# Patient Record
Sex: Female | Born: 1980 | Race: White | Hispanic: No | Smoking: Current every day smoker
Health system: Southern US, Community
[De-identification: ages and names within clinical notes are randomized; demographics above are authoritative.]

## PROBLEM LIST (undated history)

## (undated) DIAGNOSIS — G5600 Carpal tunnel syndrome, unspecified upper limb: Secondary | ICD-10-CM

## (undated) DIAGNOSIS — F1910 Other psychoactive substance abuse, uncomplicated: Secondary | ICD-10-CM

## (undated) DIAGNOSIS — Z9114 Patient's other noncompliance with medication regimen: Secondary | ICD-10-CM

## (undated) DIAGNOSIS — F329 Major depressive disorder, single episode, unspecified: Secondary | ICD-10-CM

## (undated) DIAGNOSIS — F32A Depression, unspecified: Secondary | ICD-10-CM

## (undated) DIAGNOSIS — R569 Unspecified convulsions: Secondary | ICD-10-CM

## (undated) DIAGNOSIS — F319 Bipolar disorder, unspecified: Secondary | ICD-10-CM

## (undated) DIAGNOSIS — Z91148 Patient's other noncompliance with medication regimen for other reason: Secondary | ICD-10-CM

## (undated) HISTORY — PX: OVARIAN CYST REMOVAL: SHX89

---
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
## 1999-02-21 ENCOUNTER — Emergency Department (HOSPITAL_COMMUNITY): Admission: EM | Admit: 1999-02-21 | Discharge: 1999-02-21 | Payer: Self-pay | Admitting: Emergency Medicine

## 2001-06-18 ENCOUNTER — Emergency Department (HOSPITAL_COMMUNITY): Admission: EM | Admit: 2001-06-18 | Discharge: 2001-06-18 | Payer: Self-pay | Admitting: Emergency Medicine

## 2001-07-11 ENCOUNTER — Other Ambulatory Visit: Admission: RE | Admit: 2001-07-11 | Discharge: 2001-07-11 | Payer: Self-pay | Admitting: Obstetrics and Gynecology

## 2002-01-11 ENCOUNTER — Ambulatory Visit (HOSPITAL_COMMUNITY): Admission: RE | Admit: 2002-01-11 | Discharge: 2002-01-11 | Payer: Self-pay | Admitting: Obstetrics and Gynecology

## 2002-01-25 ENCOUNTER — Inpatient Hospital Stay (HOSPITAL_COMMUNITY): Admission: AD | Admit: 2002-01-25 | Discharge: 2002-01-28 | Payer: Self-pay | Admitting: Obstetrics and Gynecology

## 2002-03-21 ENCOUNTER — Emergency Department (HOSPITAL_COMMUNITY): Admission: EM | Admit: 2002-03-21 | Discharge: 2002-03-21 | Payer: Self-pay | Admitting: Internal Medicine

## 2002-03-24 ENCOUNTER — Emergency Department (HOSPITAL_COMMUNITY): Admission: EM | Admit: 2002-03-24 | Discharge: 2002-03-24 | Payer: Self-pay | Admitting: *Deleted

## 2002-08-30 ENCOUNTER — Emergency Department (HOSPITAL_COMMUNITY): Admission: EM | Admit: 2002-08-30 | Discharge: 2002-08-30 | Payer: Self-pay | Admitting: Emergency Medicine

## 2002-08-30 ENCOUNTER — Encounter: Payer: Self-pay | Admitting: Emergency Medicine

## 2004-07-31 ENCOUNTER — Emergency Department (HOSPITAL_COMMUNITY): Admission: EM | Admit: 2004-07-31 | Discharge: 2004-07-31 | Payer: Self-pay | Admitting: Emergency Medicine

## 2004-08-13 ENCOUNTER — Emergency Department (HOSPITAL_COMMUNITY): Admission: EM | Admit: 2004-08-13 | Discharge: 2004-08-13 | Payer: Self-pay | Admitting: *Deleted

## 2004-11-23 HISTORY — PX: CHOLECYSTECTOMY OPEN: SUR202

## 2005-05-05 ENCOUNTER — Ambulatory Visit (HOSPITAL_COMMUNITY): Admission: RE | Admit: 2005-05-05 | Discharge: 2005-05-05 | Payer: Self-pay | Admitting: Obstetrics and Gynecology

## 2005-06-12 ENCOUNTER — Inpatient Hospital Stay (HOSPITAL_COMMUNITY): Admission: RE | Admit: 2005-06-12 | Discharge: 2005-06-14 | Payer: Self-pay | Admitting: General Surgery

## 2005-09-03 ENCOUNTER — Ambulatory Visit (HOSPITAL_COMMUNITY): Admission: AD | Admit: 2005-09-03 | Discharge: 2005-09-03 | Payer: Self-pay | Admitting: Obstetrics and Gynecology

## 2005-10-26 ENCOUNTER — Inpatient Hospital Stay (HOSPITAL_COMMUNITY): Admission: AD | Admit: 2005-10-26 | Discharge: 2005-10-28 | Payer: Self-pay | Admitting: Obstetrics and Gynecology

## 2006-07-29 ENCOUNTER — Ambulatory Visit (HOSPITAL_COMMUNITY): Admission: RE | Admit: 2006-07-29 | Discharge: 2006-07-29 | Payer: Self-pay | Admitting: Obstetrics and Gynecology

## 2008-03-21 ENCOUNTER — Emergency Department (HOSPITAL_COMMUNITY): Admission: EM | Admit: 2008-03-21 | Discharge: 2008-03-21 | Payer: Self-pay | Admitting: Emergency Medicine

## 2008-03-28 ENCOUNTER — Ambulatory Visit (HOSPITAL_COMMUNITY): Admission: RE | Admit: 2008-03-28 | Discharge: 2008-03-28 | Payer: Self-pay | Admitting: Neurology

## 2009-07-05 ENCOUNTER — Other Ambulatory Visit: Payer: Self-pay

## 2009-07-05 ENCOUNTER — Ambulatory Visit: Payer: Self-pay | Admitting: Psychiatry

## 2009-07-05 ENCOUNTER — Inpatient Hospital Stay (HOSPITAL_COMMUNITY): Admission: RE | Admit: 2009-07-05 | Discharge: 2009-07-12 | Payer: Self-pay | Admitting: Psychiatry

## 2009-09-13 ENCOUNTER — Emergency Department (HOSPITAL_COMMUNITY): Admission: EM | Admit: 2009-09-13 | Discharge: 2009-09-13 | Payer: Self-pay | Admitting: Emergency Medicine

## 2009-09-15 ENCOUNTER — Other Ambulatory Visit: Payer: Self-pay | Admitting: Emergency Medicine

## 2009-09-15 ENCOUNTER — Ambulatory Visit: Payer: Self-pay | Admitting: Psychiatry

## 2009-09-15 ENCOUNTER — Inpatient Hospital Stay (HOSPITAL_COMMUNITY): Admission: EM | Admit: 2009-09-15 | Discharge: 2009-09-20 | Payer: Self-pay | Admitting: Psychiatry

## 2009-12-23 ENCOUNTER — Ambulatory Visit (HOSPITAL_COMMUNITY): Admission: RE | Admit: 2009-12-23 | Discharge: 2009-12-23 | Payer: Self-pay | Admitting: Obstetrics & Gynecology

## 2010-10-29 ENCOUNTER — Emergency Department (HOSPITAL_COMMUNITY)
Admission: EM | Admit: 2010-10-29 | Discharge: 2010-10-29 | Payer: Self-pay | Source: Home / Self Care | Admitting: Emergency Medicine

## 2010-11-11 LAB — ABO/RH: RH Type: POSITIVE

## 2010-11-11 LAB — HEPATITIS B SURFACE ANTIGEN: Hepatitis B Surface Ag: NEGATIVE

## 2010-11-23 NOTE — L&D Delivery Note (Signed)
Delivery Note At 6:46 PM a viable female was delivered via Vaginal, Spontaneous Delivery (Presentation: LOA;  ).  APGAR: 9, 9; weight .   Placenta status: Intact, Spontaneous.  Cord: 3 vessels with the following complications: None.   Anesthesia: Epidural had just been placed but not started. Episiotomy: None Lacerations: None Suture Repair: none Est. Blood Loss (mL):  Mom to postpartum.  Baby to nursery-stable.  Loran Fleet 06/30/2011, 7:03 PM

## 2011-02-02 ENCOUNTER — Other Ambulatory Visit (HOSPITAL_COMMUNITY)
Admission: RE | Admit: 2011-02-02 | Discharge: 2011-02-02 | Disposition: A | Discharge status: Home / Self Care | Payer: Medicaid Other | Source: Ambulatory Visit | Attending: Obstetrics and Gynecology | Admitting: Obstetrics and Gynecology

## 2011-02-02 ENCOUNTER — Other Ambulatory Visit: Payer: Self-pay | Admitting: Obstetrics and Gynecology

## 2011-02-02 DIAGNOSIS — Z113 Encounter for screening for infections with a predominantly sexual mode of transmission: Secondary | ICD-10-CM | POA: Insufficient documentation

## 2011-02-02 DIAGNOSIS — Z01419 Encounter for gynecological examination (general) (routine) without abnormal findings: Secondary | ICD-10-CM | POA: Insufficient documentation

## 2011-02-02 LAB — URINALYSIS, ROUTINE W REFLEX MICROSCOPIC
Glucose, UA: NEGATIVE mg/dL
Hgb urine dipstick: NEGATIVE
Ketones, ur: >80 mg/dL — AB
Nitrite: NEGATIVE
Protein, ur: NEGATIVE mg/dL
Specific Gravity, Urine: >1.03 — ABNORMAL HIGH (ref 1.005–1.030)
Urobilinogen, UA: 1 mg/dL (ref 0.0–1.0)
pH: 5.5 (ref 5.0–8.0)

## 2011-02-02 LAB — POCT I-STAT, CHEM 8
Chloride: 104 mEq/L (ref 96–112)
Creatinine, Ser: 0.7 mg/dL (ref 0.4–1.2)
HCT: 45 % (ref 36.0–46.0)
Hemoglobin: 15.3 g/dL — ABNORMAL HIGH (ref 12.0–15.0)

## 2011-02-02 LAB — POCT PREGNANCY, URINE: Preg Test, Ur: POSITIVE

## 2011-02-26 LAB — DIFFERENTIAL
Eosinophils Absolute: 0.4 10*3/uL (ref 0.0–0.7)
Eosinophils Relative: 4 % (ref 0–5)
Lymphocytes Relative: 25 % (ref 12–46)
Monocytes Absolute: 0.7 10*3/uL (ref 0.1–1.0)
Monocytes Relative: 7 % (ref 3–12)
Neutro Abs: 6.6 10*3/uL (ref 1.7–7.7)
Neutrophils Relative %: 64 % (ref 43–77)

## 2011-02-26 LAB — URINALYSIS, ROUTINE W REFLEX MICROSCOPIC
Bilirubin Urine: NEGATIVE
Hgb urine dipstick: NEGATIVE
Hgb urine dipstick: NEGATIVE
Nitrite: NEGATIVE
Nitrite: NEGATIVE
Protein, ur: NEGATIVE mg/dL
Specific Gravity, Urine: 1.011 (ref 1.005–1.030)
Urobilinogen, UA: 0.2 mg/dL (ref 0.0–1.0)
pH: 5.5 (ref 5.0–8.0)

## 2011-02-26 LAB — PHENYTOIN LEVEL, TOTAL: Phenytoin Lvl: <2.5 ug/mL — ABNORMAL LOW (ref 10.0–20.0)

## 2011-02-26 LAB — BASIC METABOLIC PANEL
BUN: 5 mg/dL — ABNORMAL LOW (ref 6–23)
CO2: 25 mEq/L (ref 19–32)
Calcium: 9.2 mg/dL (ref 8.4–10.5)
GFR calc non Af Amer: >60 mL/min (ref >60)
Glucose, Bld: 104 mg/dL — ABNORMAL HIGH (ref 70–99)

## 2011-02-26 LAB — RAPID URINE DRUG SCREEN, HOSP PERFORMED
Amphetamines: NOT DETECTED
Barbiturates: NOT DETECTED
Benzodiazepines: POSITIVE — AB
Opiates: NOT DETECTED

## 2011-02-26 LAB — CBC
HCT: 42.8 % (ref 36.0–46.0)
MCV: 95 fL (ref 78.0–100.0)
RDW: 13.4 % (ref 11.5–15.5)

## 2011-02-26 LAB — URINE MICROSCOPIC-ADD ON

## 2011-02-26 LAB — PREGNANCY, URINE: Preg Test, Ur: NEGATIVE

## 2011-02-26 LAB — URINE CULTURE: Colony Count: NO GROWTH

## 2011-02-28 LAB — BASIC METABOLIC PANEL
BUN: 11 mg/dL (ref 6–23)
CO2: 26 mEq/L (ref 19–32)
Creatinine, Ser: 0.73 mg/dL (ref 0.4–1.2)
Glucose, Bld: 103 mg/dL — ABNORMAL HIGH (ref 70–99)
Sodium: 137 mEq/L (ref 135–145)

## 2011-02-28 LAB — CBC
Hemoglobin: 14.2 g/dL (ref 12.0–15.0)
MCHC: 35 g/dL (ref 30.0–36.0)
MCV: 96.6 fL (ref 78.0–100.0)
Platelets: 256 10*3/uL (ref 150–400)
RBC: 4.21 MIL/uL (ref 3.87–5.11)
RDW: 13.7 % (ref 11.5–15.5)
WBC: 8.4 10*3/uL (ref 4.0–10.5)

## 2011-02-28 LAB — RAPID URINE DRUG SCREEN, HOSP PERFORMED
Barbiturates: NOT DETECTED
Benzodiazepines: POSITIVE — AB
Opiates: NOT DETECTED

## 2011-02-28 LAB — DIFFERENTIAL
Eosinophils Absolute: 0.3 10*3/uL (ref 0.0–0.7)
Lymphs Abs: 3.1 10*3/uL (ref 0.7–4.0)

## 2011-04-07 NOTE — Procedures (Signed)
NAMEBRITTANI, Alexandra Ball             ACCOUNT NO.:  [id number]   MEDICAL RECORD NO.:  [id number]          PATIENT TYPE:  OUT   LOCATION:  RESP                          FACILITY:  [facility]   PHYSICIAN:  Kofi A. Gerilyn Pilgrim, M.D. DATE OF BIRTH:  1981/07/22   DATE OF PROCEDURE:  03/28/2008  DATE OF DISCHARGE:                              EEG INTERPRETATION   INDICATION:  This is a 30 year old female who presents with recurrent  seizures.   MEDICATIONS:  Phenobarbital.   ANALYSIS:  A 16-channel recording is conducted for 29 minutes.  There is  a well-formed posterior rhythm of 12 hertz bilaterally, which attenuates  with eye opening.  There is beta activity observed in the frontal areas.  Awake and sleep activities are recorded.  Stage 2 sleep with K-  complexes and  sleep spindles observed.  Photic stimulation is conducted  without significant changes in the background activity.  There is no  focal or lateralized slowing.  There is no epileptiform activity  observed.   IMPRESSION:  Normal recording of awake and sleep states, however, a  single recording does not rule out epileptic seizures.  A prolonged 1-  hour electroencephalogram can be useful.      Kofi A. Gerilyn Pilgrim, M.D.  Electronically Signed     KAD/MEDQ  D:  03/29/2008  T:  03/30/2008  Job:  [id number]
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
## 2011-04-10 NOTE — Group Therapy Note (Signed)
[name], [name]             ACCOUNT NO.:  [id number]   MEDICAL RECORD NO.:  [id number]          PATIENT TYPE:  OIB   LOCATION:  A415                          FACILITY:  [facility]   PHYSICIAN:  Lazaro Arms, M.D.   DATE OF BIRTH:  1980-12-22   DATE OF PROCEDURE:  09/03/2005  DATE OF DISCHARGE:                                   PROGRESS NOTE   Alexandra Ball was seen in the office this morning with complaints of abdominal  cramping. Vaginal exam revealed a closed cervix but a rectum that is full of  stool. She does have a history of constipation. She was sent to labor and  delivery for rule out contractions. She is having some uterine irritability.  However, that is felt to be due to her intestinal issues. We are at this  time awaiting supplies for a milk and molasses enema. Fetal heart rate looks  fine.   IMPRESSION:  Intrauterine pregnancy at 32 weeks. Constipation.   PLAN:  Milk and molasses enema.      [name], C.N.M.      Lazaro Arms, M.D.  Electronically Signed    FC/MEDQ  D:  09/03/2005  T:  09/03/2005  Job:  [id number]

## 2011-04-10 NOTE — Op Note (Signed)
[name], Alexandra Ball             ACCOUNT NO.:  [id number]   MEDICAL RECORD NO.:  [id number]          PATIENT TYPE:  INP   LOCATION:  A428                          FACILITY:  [facility]   PHYSICIAN:  Dirk Dress. Katrinka Blazing, M.D.   DATE OF BIRTH:  [birth year]   DATE OF PROCEDURE:  06/12/2005  DATE OF DISCHARGE:                                 OPERATIVE REPORT   PREOPERATIVE DIAGNOSES:  Cholelithiasis, cholecystitis with intrauterine  pregnancy.   POSTOPERATIVE DIAGNOSES:  Cholelithiasis, cholecystitis with intrauterine  pregnancy.   PROCEDURE:  Open cholecystectomy.   SURGEON:  Dr. Katrinka Blazing.   DESCRIPTION OF PROCEDURE:  Under general anesthesia, the patient was prepped  and draped in the sterile field.  A limited right subcostal incision was  made.  The incision extended through the subcutaneous tissue to the fascia  and muscle.  The fascia was opened transversely and the muscle was divided  with electrocautery.  The peritoneal cavity was entered.  The large gravid  uterus extended up to the an area within a few cm of the operative incision.  The uterus was gently packed off.  The patient was placed in slight reverse  Trendelenburg position.  A Balfour retractor was placed.  The gallbladder  was grasped.  The gallbladder was separated from the intrahepatic bed using  electrocautery.  Vascular structures on the posterior wall of the  gallbladder were dissected, clipped with hemoclips and divided.  Dissection  was continued down to the cystic duct.  The cystic duct was clamped with a  right angle clamp and transected.  The gallbladder was excised.  The cystic  duct was tied with a ligature of 2-0 silk and then clipped with three clips.  There was some minimal bleeding from the bed.  This was controlled with  electrocautery.  Irrigation was carried out.  There appeared to be no  bleeding.  There was no evidence of bile leak.  Sponge, needle, instrument  and blade counts were verified as correct times  two.  The abdomen was  closed.  The posterior rectus sheath was closed with running 0 Prolene.  The  anterior rectus sheath was closed with running 0 Prolene.  The subcutaneous  tissue was closed with 2-0 Monocryl and the skin was closed with staples.  An Op-Site dressing was placed.  The patient was awakened from anesthesia  uneventfully, transferred to a bed and taken to the postanesthetic care  unit.  In the postanesthetic care unit, fetal heart tones were present with  a rate of about 135 to 137 per minute.  There was active fetal movement.      Dirk Dress. Katrinka Blazing, M.D.  Electronically Signed    LCS/MEDQ  D:  06/13/2005  T:  06/13/2005  Job:  [id number]   cc:   Tilda Burrow, M.D.  Fax: [phone]

## 2011-04-10 NOTE — Op Note (Signed)
[facility]  Patient:    KALEE, BROXTON Visit Number: [id number] MRN: [id number]          Service Type: MED Location: 4A A428 01 Attending Physician:  Tilda Burrow Dictated by:   Pat Kocher, C.N.M. Proc. Date: 01/26/02 Admit Date:  01/25/2002 Discharge Date: 01/28/2002   CC:         [facility]  Vivia Ewing, D.O.   Operative Report  DELIVERY NOTE  Jolanda was noted to be fully dilated at +1 to 2 station at approximately 10:15.  I arrived at her bedside at 10:30 a.m. with the baby at a nearly crowning position. After a very brief second stage, Ronne delivered viable female infant through a loose nuchal cord at 10:35 a.m.   The mouth and nose were suctioned with a bulb syringe.  Tone was good with a respiratory effort. Apgars are 7/9.  The baby was placed on the mothers abdomen, cord doubly clamped and cut.  Pitocin 20 units diluted in 1000 cc of lactated Ringers was then infused rapidly IV.  The placenta separated spontaneously and was delivered via controlled cord traction and maternal pushing effort at 10:43. The fundus was massaged and immediately firm with minimal blood loss noted. The vagina was then inspected, and no lacerations were found.  Estimated blood loss 300 cc.  The patient tolerated the delivery very well. Dictated by:   Pat Kocher, C.N.M. Attending Physician:  Tilda Burrow DD:  01/26/02 TD:  01/28/02 Job: [id number]

## 2011-04-10 NOTE — H&P (Signed)
NAMEELISABEL, HANOVER             ACCOUNT NO.:  [id number]   MEDICAL RECORD NO.:  [id number]          PATIENT TYPE:  AMB   LOCATION:  DAY                           FACILITY:  [facility]   PHYSICIAN:  Jerolyn Shin C. Katrinka Blazing, M.D.   DATE OF BIRTH:  Nov 14, 1981   DATE OF ADMISSION:  DATE OF DISCHARGE:  LH                                HISTORY & PHYSICAL   The patient is a 30 year old female with a two-month history of severe pain  in the epigastrium and right upper quadrant.  She has severe discomfort  every day.  She has symptoms with every meal.  The patient is [redacted] weeks  gestation.  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  She has very severe  symptomatic gallbladder disease and is scheduled for open cholecystectomy  because of her gestational age and the size of her uterus.  It is felt that  cholecystectomy is needed to prevent acute cholecystitis which may be a  factor to cause yet another miscarriage.  The patient understands that even  with removal of the gallbladder, that she may have induction of labor in  perioperative period.  She will see by Dr. Emelda Fear who is her gynecologist  and he will manage her pregnancy perioperatively.   PAST MEDICAL HISTORY:  She has no other medical illness.  She has not had  any surgery.   MEDICATIONS:  Her only medication is prenatal vitamins.   FAMILY HISTORY:  Positive for atherosclerotic heart disease, diabetes  mellitus and stroke.   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 She is single.  She smokes  half a pack of cigarettes per day.  She denies alcohol use.  There is a  prior history of cocaine, ectasy and marijuana use.   PHYSICAL EXAMINATION:  GENERAL:  She is a pleasant, young female in no acute  distress.  VITAL SIGNS:  Blood pressure 106/60, pulse 76, respirations 20, weight 127  pounds.  HEENT:  Unremarkable except that she has a piercing in her left upper  eyebrow.  NECK:  Supple.  No JVD, bruits, adenopathy, or thyromegaly.  CHEST:  Clear to auscultation.  HEART:  Regular rate and rhythm without murmur, gallop or rub.  ABDOMEN:  No upper abdominal tenderness.  No masses.  Normoactive bowel  sounds.  Very large gravid uterus which extends above the umbilicus.  EXTREMITIES:  No cyanosis, clubbing or edema.  SKIN:  Unremarkable except for a tattoo of her mid lower back.   IMPRESSION:  1.  Cholelithiasis with cholecystitis.  2.  Severe biliary colic with intractable nausea and vomiting.  3.  Intrauterine pregnancy [redacted] weeks gestation.   PLAN:  Open cholecystectomy and perioperative monitoring by obstetrical  service.      Dirk Dress. Katrinka Blazing, M.D.  Electronically Signed     LCS/MEDQ  D:  06/11/2005  T:  06/12/2005  Job:  [id number]   cc:   Tilda Burrow, M.D.  Fax: [phone]   [facility]  Fax: [phone]

## 2011-04-10 NOTE — H&P (Signed)
Alexandra Ball, Alexandra Ball             ACCOUNT NO.:  [id number]   MEDICAL RECORD NO.:  [id number]          PATIENT TYPE:  INP   LOCATION:  LDR1                          FACILITY:  [facility]   PHYSICIAN:  Tilda Burrow, M.D. DATE OF BIRTH:  May 12, 1981   DATE OF ADMISSION:  10/26/2005  DATE OF DISCHARGE:  LH                                HISTORY & PHYSICAL   REASON FOR ADMISSION:  Pregnancy at 39 weeks for elective induction of labor  due to maternal discomfort.  After careful discussion with Alexandra Ball regarding  risks and benefits of elective induction, Alexandra Ball still opts for induction of  labor since Alexandra Ball had a prior induction of labor.   PAST MEDICAL HISTORY:  Negative.   PAST SURGICAL HISTORY:  Positive for D&C.   ALLERGIES:  TYLOX.   PRENATAL COURSE:  Essentially uneventful.   PRENATAL LABORATORY DATA:  Blood type A positive.  UDS positive for opiates  and benzodiazepines.  Rubella immune.  Hepatitis B surface antigen negative.  HIV negative.  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  Serology is nonreactive.  Pap smear is Class I.  GC and  Chlamydia is negative.  GBS is negative.  A 28-wee hemoglobin was 9.2,  hematocrit 30.8.  One-hour glucose 94.  GBS negative.   PHYSICAL EXAMINATION:  VITAL SIGNS:  Stable.  PELVIC:  Cervix 1 cm, still thick and -2.   PLAN:  Will admit for Foley bulb induction of labor and Pitocin.      Zerita Boers, Lanier Clam      Tilda Burrow, M.D.  Electronically Signed    DL/MEDQ  D:  16/08/9603  T:  10/27/2005  Job:  [id number]   cc:   Lorin Picket A. Gerda Diss, MD  Fax: [phone]   [facility]

## 2011-04-10 NOTE — Op Note (Signed)
NAMEKATHLEE, [name] NO.:  [id number]   MEDICAL RECORD NO.:  [id number]          PATIENT TYPE:  INP   LOCATION:  A402                          FACILITY:  [facility]   PHYSICIAN:  Lazaro Arms, M.D.   DATE OF BIRTH:  03-08-1981   DATE OF PROCEDURE:  10/27/2005  DATE OF DISCHARGE:  10/28/2005                                 OPERATIVE REPORT   Alexandra Ball is a 30 year old female, gravida 2, para 1, who was admitted for  elective induction the night prior. She is undergoing Pitocin induction  currently and is requesting placement of an epidural. The patient is placed  in a sitting position. Betadine prep was used. One percent lidocaine is  injected into the L3-L4 interspace. The area is field draped. A 17-gauge  Tuohy needle was used and a loss-of-resistance technique employed, and the  epidural space was found without difficulty. Ten cc of 0.125% bupivacaine  was given as a test dose without ill effects. The epidural catheter was then  fed 5 cm into the epidural space. An additional 10 cc was given again  without difficulty. It was taped down. A continuous infusion of 0.125%  bupivacaine with 2 mcg/fentanyl was begun at 12 cc per hour. The patient was  getting good pain relief. The fetal heart rate tracing is stable, and the  blood pressure is stable.      Lazaro Arms, M.D.  Electronically Signed     LHE/MEDQ  D:  12/09/2005  T:  12/09/2005  Job:  [id number]

## 2011-04-10 NOTE — Op Note (Signed)
NAMETEAGYN, FISHEL NO.:  [id number]   MEDICAL RECORD NO.:  [id number]          PATIENT TYPE:  INP   LOCATION:  LDR1                          FACILITY:  [facility]   PHYSICIAN:  Tilda Burrow, M.D. DATE OF BIRTH:  12/06/1980   DATE OF PROCEDURE:  DATE OF DISCHARGE:                                 OPERATIVE REPORT   DELIVERY NOTE:  Alexandra Ball progressed rapidly after receiving her epidural and  was noted to be 10 cm dilated with an urge to push at approximately 11:30. I  came to labor and delivery, and sure enough, the baby was crowning. She had  a very short second stage and had a spontaneous vaginal delivery of a viable  female infant at 11:47. Mouth and nose were suctioned on the perineum and  the baby delivered without difficulty. Twenty units of Pitocin diluted in  1,000 cc of lactated ringers was infused rapidly IV. The placenta separated  spontaneously and was delivered via controlled cord traction and Duncan  presentation at 11:50. It was inspected and appeared to be intact with a  three-vessel cord. Lochia was minimal. Fundus was firm. The bladder had been  catheterized right prior to delivery, relieving about 50 cc of urine from  the bladder at that time. The epidural catheter was then removed with the  blue tip visualized as being intact.      [name], C.N.M.      Tilda Burrow, M.D.  Electronically Signed    FC/MEDQ  D:  10/27/2005  T:  10/27/2005  Job:  [id number]   cc:   Gerda Diss, M.D.

## 2011-04-10 NOTE — H&P (Signed)
[facility]  Patient:    Alexandra Ball, Alexandra Ball Visit Number: [id number] MRN: [id number]          Service Type: MED Location: 4A A428 [id number] Attending Physician:  Tilda Burrow Dictated by:   Zerita Boers, N.M. Admit Date:  01/25/2002   CC:         [facility]  Christin Bach, M.D.   History and Physical  DATE OF BIRTH:  Aug 12, 1981.  REASON FOR ADMISSION:  Pregnancy at 40 weeks and 4 days for elective induction of labor.  HISTORY OF PRESENT ILLNESS:  Alexandra Ball is a 30 year old gravida 2, para 0, abortus 1 with an EDC of March 2, which is consistent with an ultrasound at 8 weeks. She was into the office today complaining of severe back pain, inability to sleep, and a desire to be induced due to maternal fatigue and discomfort. Discussed the risks and benefits of elective induction and patient continues to desire induction.  CURRENT MEDICATIONS:  She is on prenatal vitamins.  ALLERGIES:  She has no known allergies.  PAST MEDICAL HISTORY:  Negative.  PAST SURGICAL HISTORY:  Positive for a D&C in 2001.  SOCIAL HISTORY:  She is single.  FAMILY HISTORY:  Positive for breast CA, lung CA, and diabetes and cardiac disease.  PRENATAL COURSE:  Essentially uneventful. Blood type is A-positive. UDS is positive for THC early in the pregnancy. Rubella is immune, hepatitis B, HIV, serology, GC and chlamydia are all negative. GBS is pending. Pap is class I. Hemoglobin 11.0, 28 weeks hematocrit 33.6, 1-hour glucose tolerance was 67.  PHYSICAL EXAMINATION:  VITAL SIGNS:  Weight 151. Blood pressure 118/80.  ABDOMEN:  Fundal height is 36 cm. Fetal heart is 130 strong and regular. Good fetal movement per patient and was also noted with Leopolds maneuver.  PELVIC:  Cervical exam:  The patient is 1-2 cm, 90% effaced, in -1 station.  PLAN:  We are going to admit for Pitocin induction, for labor in the a.m. I discussed patients plan of care with Dr.  Despina Hidden. He is in agreement to proceed with induction. Dictated by:   Zerita Boers, N.M. Attending Physician:  Tilda Burrow DD:  01/25/02 TD:  01/25/02 Job: [id number]

## 2011-06-05 LAB — STREP B DNA PROBE: GBS: POSITIVE

## 2011-06-30 ENCOUNTER — Encounter (HOSPITAL_COMMUNITY): Payer: Self-pay | Admitting: Anesthesiology

## 2011-06-30 ENCOUNTER — Inpatient Hospital Stay (HOSPITAL_COMMUNITY)
Admission: RE | Admit: 2011-06-30 | Discharge: 2011-07-02 | DRG: 767 | Disposition: A | Discharge status: Home / Self Care | Payer: Medicaid Other | Source: Ambulatory Visit | Attending: Obstetrics and Gynecology | Admitting: Obstetrics and Gynecology

## 2011-06-30 ENCOUNTER — Encounter (HOSPITAL_COMMUNITY): Payer: Self-pay

## 2011-06-30 ENCOUNTER — Inpatient Hospital Stay (HOSPITAL_COMMUNITY): Payer: Medicaid Other | Admitting: Anesthesiology

## 2011-06-30 DIAGNOSIS — Z3490 Encounter for supervision of normal pregnancy, unspecified, unspecified trimester: Secondary | ICD-10-CM

## 2011-06-30 DIAGNOSIS — Z9851 Tubal ligation status: Secondary | ICD-10-CM

## 2011-06-30 DIAGNOSIS — O480 Post-term pregnancy: Principal | ICD-10-CM | POA: Diagnosis present

## 2011-06-30 DIAGNOSIS — Z22330 Carrier of Group B streptococcus: Secondary | ICD-10-CM

## 2011-06-30 DIAGNOSIS — O0940 Supervision of pregnancy with grand multiparity, unspecified trimester: Secondary | ICD-10-CM

## 2011-06-30 DIAGNOSIS — Z302 Encounter for sterilization: Secondary | ICD-10-CM

## 2011-06-30 DIAGNOSIS — O9989 Other specified diseases and conditions complicating pregnancy, childbirth and the puerperium: Secondary | ICD-10-CM

## 2011-06-30 DIAGNOSIS — O99892 Other specified diseases and conditions complicating childbirth: Secondary | ICD-10-CM | POA: Diagnosis present

## 2011-06-30 HISTORY — DX: Other psychoactive substance abuse, uncomplicated: F19.10

## 2011-06-30 HISTORY — DX: Unspecified convulsions: R56.9

## 2011-06-30 LAB — RAPID URINE DRUG SCREEN, HOSP PERFORMED
Barbiturates: NOT DETECTED
Tetrahydrocannabinol: NOT DETECTED

## 2011-06-30 LAB — RPR: RPR Ser Ql: NONREACTIVE

## 2011-06-30 LAB — CBC
HCT: 31 % — ABNORMAL LOW (ref 36.0–46.0)
Hemoglobin: 10.2 g/dL — ABNORMAL LOW (ref 12.0–15.0)
MCH: 30.5 pg (ref 26.0–34.0)
MCHC: 32.9 g/dL (ref 30.0–36.0)

## 2011-06-30 MED ORDER — TETANUS-DIPHTH-ACELL PERTUSSIS 5-2.5-18.5 LF-MCG/0.5 IM SUSP: 0.5000 mL | Freq: Once | INTRAMUSCULAR | Status: DC

## 2011-06-30 MED ORDER — DIPHENHYDRAMINE HCL 25 MG PO CAPS: 25.0000 mg | ORAL_CAPSULE | Freq: Four times a day (QID) | ORAL | Status: DC | PRN

## 2011-06-30 MED ORDER — BENZOCAINE-MENTHOL 20-0.5 % EX AERO
1.0000 "application " | INHALATION_SPRAY | CUTANEOUS | Status: DC | PRN
  Administered 2011-07-01: 1 via TOPICAL

## 2011-06-30 MED ORDER — PHENYLEPHRINE 40 MCG/ML (10ML) SYRINGE FOR IV PUSH (FOR BLOOD PRESSURE SUPPORT): 80.0000 ug | PREFILLED_SYRINGE | INTRAVENOUS | Status: DC | PRN

## 2011-06-30 MED ORDER — TERBUTALINE SULFATE 1 MG/ML IJ SOLN
0.2500 mg | Freq: Once | INTRAMUSCULAR | Status: DC | PRN
Start: 2011-06-30 — End: 2011-06-30

## 2011-06-30 MED ORDER — OXYCODONE-ACETAMINOPHEN 5-325 MG PO TABS: 2.0000 | ORAL_TABLET | ORAL | Status: DC | PRN

## 2011-06-30 MED ORDER — LACTATED RINGERS IV SOLN: 500.0000 mL | Freq: Once | INTRAVENOUS | Status: DC

## 2011-06-30 MED ORDER — MISOPROSTOL 25 MCG QUARTER TABLET
25.0000 ug | ORAL_TABLET | ORAL | Status: DC | PRN
Start: 2011-06-30 — End: 2011-06-30
  Administered 2011-06-30 (×2): 25 ug via VAGINAL
  Filled 2011-06-30 (×2): qty 0.25
  Filled 2011-06-30: qty 1

## 2011-06-30 MED ORDER — PENICILLIN G POTASSIUM 5000000 UNITS IJ SOLR
2.5000 10*6.[IU] | INTRAVENOUS | Status: DC
  Administered 2011-06-30 (×2): 2.5 10*6.[IU] via INTRAVENOUS
  Filled 2011-06-30 (×5): qty 2.5

## 2011-06-30 MED ORDER — ONDANSETRON HCL 4 MG/2ML IJ SOLN: 4.0000 mg | INTRAMUSCULAR | Status: DC | PRN

## 2011-06-30 MED ORDER — EPHEDRINE 5 MG/ML INJ
10.0000 mg | INTRAVENOUS | Status: DC | PRN
  Filled 2011-06-30: qty 4

## 2011-06-30 MED ORDER — PHENYLEPHRINE 40 MCG/ML (10ML) SYRINGE FOR IV PUSH (FOR BLOOD PRESSURE SUPPORT)
80.0000 ug | PREFILLED_SYRINGE | INTRAVENOUS | Status: DC | PRN
  Filled 2011-06-30: qty 5

## 2011-06-30 MED ORDER — IBUPROFEN 600 MG PO TABS: 600.0000 mg | ORAL_TABLET | Freq: Four times a day (QID) | ORAL | Status: DC | PRN

## 2011-06-30 MED ORDER — ONDANSETRON HCL 4 MG/2ML IJ SOLN: 4.0000 mg | Freq: Four times a day (QID) | INTRAMUSCULAR | Status: DC | PRN

## 2011-06-30 MED ORDER — WITCH HAZEL-GLYCERIN EX PADS: 1.0000 "application " | MEDICATED_PAD | CUTANEOUS | Status: DC | PRN

## 2011-06-30 MED ORDER — SENNOSIDES-DOCUSATE SODIUM 8.6-50 MG PO TABS
2.0000 | ORAL_TABLET | Freq: Every day | ORAL | Status: DC
  Administered 2011-06-30 – 2011-07-01 (×2): 2 via ORAL

## 2011-06-30 MED ORDER — PHENYLEPHRINE 40 MCG/ML (10ML) SYRINGE FOR IV PUSH (FOR BLOOD PRESSURE SUPPORT)
PREFILLED_SYRINGE | INTRAVENOUS | Status: AC
  Filled 2011-06-30: qty 5

## 2011-06-30 MED ORDER — OXYCODONE-ACETAMINOPHEN 5-325 MG PO TABS
1.0000 | ORAL_TABLET | ORAL | Status: DC | PRN
  Administered 2011-06-30 – 2011-07-01 (×6): 1 via ORAL
  Administered 2011-07-02: 2 via ORAL
  Administered 2011-07-02: 1 via ORAL
  Administered 2011-07-02: 2 via ORAL
  Administered 2011-07-02: 1 via ORAL
  Filled 2011-06-30 (×6): qty 1
  Filled 2011-06-30: qty 2
  Filled 2011-06-30 (×2): qty 1
  Filled 2011-06-30: qty 2

## 2011-06-30 MED ORDER — LIDOCAINE HCL (PF) 1 % IJ SOLN
30.0000 mL | INTRAMUSCULAR | Status: DC | PRN
  Filled 2011-06-30: qty 30

## 2011-06-30 MED ORDER — DIBUCAINE 1 % RE OINT: 1.0000 "application " | TOPICAL_OINTMENT | RECTAL | Status: DC | PRN

## 2011-06-30 MED ORDER — CITRIC ACID-SODIUM CITRATE 334-500 MG/5ML PO SOLN
30.0000 mL | ORAL | Status: DC | PRN
Start: 2011-06-30 — End: 2011-06-30

## 2011-06-30 MED ORDER — FENTANYL 2.5 MCG/ML BUPIVACAINE 1/10 % EPIDURAL INFUSION (WH - ANES): 14.0000 mL/h | INTRAMUSCULAR | Status: DC

## 2011-06-30 MED ORDER — OXYTOCIN 20 UNITS IN LACTATED RINGERS INFUSION - SIMPLE
125.0000 mL/h | Freq: Once | INTRAVENOUS | Status: AC
  Administered 2011-06-30: 125 mL/h via INTRAVENOUS
  Filled 2011-06-30: qty 1000

## 2011-06-30 MED ORDER — LACTATED RINGERS IV SOLN: 500.0000 mL | INTRAVENOUS | Status: DC | PRN

## 2011-06-30 MED ORDER — IBUPROFEN 600 MG PO TABS
600.0000 mg | ORAL_TABLET | Freq: Four times a day (QID) | ORAL | Status: DC
  Administered 2011-06-30 – 2011-07-02 (×6): 600 mg via ORAL
  Filled 2011-06-30 (×6): qty 1

## 2011-06-30 MED ORDER — LIDOCAINE HCL 1.5 % IJ SOLN
INTRAMUSCULAR | Status: DC | PRN
  Administered 2011-06-30 (×2): 5 mL via EPIDURAL

## 2011-06-30 MED ORDER — SIMETHICONE 80 MG PO CHEW: 80.0000 mg | CHEWABLE_TABLET | ORAL | Status: DC | PRN

## 2011-06-30 MED ORDER — ACETAMINOPHEN 325 MG PO TABS
650.0000 mg | ORAL_TABLET | ORAL | Status: DC | PRN
Start: 2011-06-30 — End: 2011-06-30

## 2011-06-30 MED ORDER — DEXTROSE 5 % IV SOLN
5.0000 10*6.[IU] | Freq: Once | INTRAVENOUS | Status: AC
  Administered 2011-06-30: 5 10*6.[IU] via INTRAVENOUS
  Filled 2011-06-30: qty 5

## 2011-06-30 MED ORDER — NALBUPHINE SYRINGE 5 MG/0.5 ML
5.0000 mg | INJECTION | INTRAMUSCULAR | Status: DC | PRN
  Administered 2011-06-30: 5 mg via INTRAVENOUS
  Filled 2011-06-30 (×2): qty 0.5

## 2011-06-30 MED ORDER — LACTATED RINGERS IV SOLN
INTRAVENOUS | Status: DC
  Administered 2011-06-30: 08:00:00 via INTRAVENOUS
  Administered 2011-06-30: 1000 mL via INTRAVENOUS

## 2011-06-30 MED ORDER — ONDANSETRON HCL 4 MG PO TABS: 4.0000 mg | ORAL_TABLET | ORAL | Status: DC | PRN

## 2011-06-30 MED ORDER — FENTANYL 2.5 MCG/ML BUPIVACAINE 1/10 % EPIDURAL INFUSION (WH - ANES)
INTRAMUSCULAR | Status: AC
  Filled 2011-06-30: qty 60

## 2011-06-30 MED ORDER — EPHEDRINE 5 MG/ML INJ
INTRAVENOUS | Status: AC
  Filled 2011-06-30: qty 4

## 2011-06-30 MED ORDER — PRENATAL PLUS 27-1 MG PO TABS: 1.0000 | ORAL_TABLET | Freq: Every day | ORAL | Status: DC

## 2011-06-30 MED ORDER — EPHEDRINE 5 MG/ML INJ: 10.0000 mg | INTRAVENOUS | Status: DC | PRN

## 2011-06-30 MED ORDER — OXYTOCIN 20 UNITS IN LACTATED RINGERS INFUSION - SIMPLE: 1.0000 m[IU]/min | INTRAVENOUS | Status: DC

## 2011-06-30 MED ORDER — LANOLIN HYDROUS EX OINT: TOPICAL_OINTMENT | CUTANEOUS | Status: DC | PRN

## 2011-06-30 MED ORDER — DIPHENHYDRAMINE HCL 50 MG/ML IJ SOLN: 12.5000 mg | INTRAMUSCULAR | Status: DC | PRN

## 2011-06-30 NOTE — H&P (Signed)
Alexandra Ball is a 30 y.o. female at [redacted]w[redacted]d presenting for IOL for post dates.  Her pregnancy has been uncomplicated to date.  She says there is good fetal movement, no blood/fluid per vagina.  Her past two pregnancies have required IOL for post-dates.  History OB History    Grav Para Term Preterm Abortions TAB SAB Ect Mult Living   5 2 2  0 2 0 2 0 0 2     Past Medical History  Diagnosis Date  . Seizures     mild one 6months ago, off meds for pregnancy. Was on lamictil and dilatin  . Substance abuse     Multiple substances   Past Surgical History  Procedure Date  . Cholecystectomy open 2006    during second pregnancy   Family History: family history is not on file. Social History:  reports that she has been smoking Cigarettes.  She has a 10 pack-year smoking history. She has never used smokeless tobacco. She reports that she uses illicit drugs (Other-see comments). She reports that she does not drink alcohol.  ROS Per HPI.  Otherwise negative. Dilation: Fingertip Effacement (%): Thick Station: -2 Exam by:: Sherry Blackard Blood pressure 121/76, pulse 108, height 5\' 2"  (1.575 m), weight 159 lb (72.122 kg). Exam Physical Exam  Gen: NAD CV: RRR Resp:CTABL Abd: Gravid, no tenderness Dilation: Fingertip Effacement (%): Thick Cervical Position: Posterior Station: -2 Presentation: Vertex Exam by:: Stevey Stapleton Ext: No edema  Fetal Tracing: Baseline 145, moderate variability, + accells, no decells Ctx: None  Prenatal labs: ABO, Rh: A pos   Antibody: Negative   Rubella: Immune   RPR: Nonreactive (12/20 0000)  HBsAg: Negative (12/20 0000)  HIV: Non-reactive (12/20 0000)  GBS: Positive (07/13 0000)  2 hr GTT: 83 --> 113 --> 101 Quad screen: Normal Anatomy US: Normal  Assessment/Plan: 1. Admit for induction with Cytotec. 2. Abx for GBS 3. Epidural on request . 4. Continuous fetal monitoring. 5. UDS for h/o drug use. 6. Post-partum tubal (papers signed and available under  media)  Brenetta Penny 06/30/2011, 9:22 AM

## 2011-06-30 NOTE — Anesthesia Preprocedure Evaluation (Signed)
Anesthesia Evaluation  Name, MR# and DOB Patient awake  General Assessment Comment  Reviewed: Allergy & Precautions, H&P , Patient's Chart, lab work & pertinent test results  Airway Mallampati: II TM Distance: >3 FB Neck ROM: full    Dental No notable dental hx.    Pulmonary  clear to auscultation  pulmonary exam normalPulmonary Exam Normal breath sounds clear to auscultation none    Cardiovascular regular Normal    Neuro/Psych   Seizures -,   Negative Neurological ROS  Negative Psych ROS  GI/Hepatic/Renal negative GI ROS, negative Liver ROS, and negative Renal ROS (+)       Endo/Other  Negative Endocrine ROS (+)      Abdominal   Musculoskeletal   Hematology negative hematology ROS (+)   Peds  Reproductive/Obstetrics (+) Pregnancy    Anesthesia Other Findings             Anesthesia Physical Anesthesia Plan  ASA: II  Anesthesia Plan: Epidural   Post-op Pain Management:    Induction:   Airway Management Planned:   Additional Equipment:   Intra-op Plan:   Post-operative Plan:   Informed Consent: I have reviewed the patients History and Physical, chart, labs and discussed the procedure including the risks, benefits and alternatives for the proposed anesthesia with the patient or authorized representative who has indicated his/her understanding and acceptance.     Plan Discussed with:   Anesthesia Plan Comments:         Anesthesia Quick Evaluation

## 2011-06-30 NOTE — Progress Notes (Signed)
Alexandra Ball is a 30 y.o. [phone] at [redacted]w[redacted]d, admitted for IOL due to post dates  Subjective: Doing well, feeling occasional cramps  Objective: BP 121/67  Pulse 91  Temp(Src) 98.2 F (36.8 C) (Oral)  Ht 5\' 2"  (1.575 m)  Wt 159 lb (72.122 kg)  BMI 29.08 kg/m2  Fetal Heart Rate: 140 Variability: Mod Accelerations: Present Decelerations: Absent  Contractions: Q8-9min  SVE:   Dilation: 1 Effacement (%): 50 Station: -2 Exam by:: Cleatus Goodin  Pitocin: None Mag: None   Assessment / Plan: 30 y.o. [id number] at [redacted]w[redacted]d here for IOL  Labor: Second dose of cytotec placed, will recheck in 4 hours and decide on further management Preeclampsia:  Not hypertensive Fetal Wellbeing: Class I Pain Control:  PRN Nubain I/D:  On PCN for GBS  Ladislav Caselli 06/30/2011, 1:42 PM

## 2011-06-30 NOTE — Anesthesia Procedure Notes (Signed)

## 2011-06-30 NOTE — Progress Notes (Signed)
  Alexandra Ball is a 30 y.o. [phone] at [redacted]w[redacted]d, admitted for IOL due to post dates  Subjective: Doing well, feeling stronger contractions  Objective: BP 118/63  Pulse 80  Temp(Src) 98.3 F (36.8 C) (Oral)  Resp 20  Ht 5\' 2"  (1.575 m)  Wt 159 lb (72.122 kg)  BMI 29.08 kg/m2  Fetal Heart Rate: 140 Variability: Mod Accelerations: Present Decelerations: Absent  Contractions: Q2-3min  SVE:   Dilation: 1.5 Effacement (%): 80 Station: 0 Exam by:: Dherr rn  Pitocin: None Mag: None  Assessment / Plan: 30 y.o. [id number] at [redacted]w[redacted]d here for IOL  Labor: s/p 2 doses of cytotec, good progress.  Will begin low-dose pitocin. Preeclampsia:  Not hypertensive Fetal Wellbeing: Class I Pain Control:  PRN Nubain.  Pt may have epidural if she desires but is not currently wanting one. I/D:  On PCN for GBS  Alexandra Ball 06/30/2011, 5:42 PM

## 2011-07-01 ENCOUNTER — Encounter (HOSPITAL_COMMUNITY)
Admission: RE | Disposition: A | Discharge status: Home / Self Care | Payer: Self-pay | Source: Ambulatory Visit | Attending: Obstetrics and Gynecology

## 2011-07-01 ENCOUNTER — Encounter (HOSPITAL_COMMUNITY): Payer: Self-pay | Admitting: Anesthesiology

## 2011-07-01 ENCOUNTER — Inpatient Hospital Stay (HOSPITAL_COMMUNITY): Payer: Medicaid Other | Admitting: Anesthesiology

## 2011-07-01 ENCOUNTER — Encounter (HOSPITAL_COMMUNITY): Payer: Self-pay

## 2011-07-01 DIAGNOSIS — Z302 Encounter for sterilization: Secondary | ICD-10-CM

## 2011-07-01 HISTORY — PX: TUBAL LIGATION: SHX77

## 2011-07-01 SURGERY — LIGATION, FALLOPIAN TUBE, POSTPARTUM
Anesthesia: Epidural | Laterality: Bilateral | Wound class: Clean

## 2011-07-01 MED ORDER — MIDAZOLAM HCL 2 MG/2ML IJ SOLN
INTRAMUSCULAR | Status: AC
  Filled 2011-07-01: qty 2

## 2011-07-01 MED ORDER — BENZOCAINE-MENTHOL 20-0.5 % EX AERO
INHALATION_SPRAY | CUTANEOUS | Status: AC
  Administered 2011-07-01: 1 via TOPICAL
  Filled 2011-07-01: qty 56

## 2011-07-01 MED ORDER — ROCURONIUM BROMIDE 50 MG/5ML IV SOLN
INTRAVENOUS | Status: AC
  Filled 2011-07-01: qty 1

## 2011-07-01 MED ORDER — FENTANYL CITRATE 0.05 MG/ML IJ SOLN
INTRAMUSCULAR | Status: DC | PRN
  Administered 2011-07-01 (×2): 50 ug via INTRAVENOUS

## 2011-07-01 MED ORDER — SODIUM BICARBONATE 8.4 % IV SOLN
INTRAVENOUS | Status: AC
  Filled 2011-07-01: qty 50

## 2011-07-01 MED ORDER — FENTANYL CITRATE 0.05 MG/ML IJ SOLN
INTRAMUSCULAR | Status: AC
  Filled 2011-07-01: qty 5

## 2011-07-01 MED ORDER — HYDROMORPHONE HCL 1 MG/ML IJ SOLN
INTRAMUSCULAR | Status: AC
  Administered 2011-07-01: 0.5 mg via INTRAVENOUS
  Filled 2011-07-01: qty 1

## 2011-07-01 MED ORDER — KETOROLAC TROMETHAMINE 30 MG/ML IJ SOLN
INTRAMUSCULAR | Status: DC | PRN
  Administered 2011-07-01: 30 mg via INTRAVENOUS

## 2011-07-01 MED ORDER — LIDOCAINE HCL (CARDIAC) 20 MG/ML IV SOLN
INTRAVENOUS | Status: DC | PRN
  Administered 2011-07-01: 30 mg via INTRAVENOUS
  Administered 2011-07-01: 70 mg via INTRAVENOUS

## 2011-07-01 MED ORDER — LIDOCAINE-EPINEPHRINE 2 %-1:100000 IJ SOLN: INTRAMUSCULAR | Status: DC | PRN

## 2011-07-01 MED ORDER — HYDROMORPHONE HCL 1 MG/ML IJ SOLN
0.5000 mg | INTRAMUSCULAR | Status: AC | PRN
  Administered 2011-07-01 (×4): 0.5 mg via INTRAVENOUS

## 2011-07-01 MED ORDER — MIDAZOLAM HCL 5 MG/5ML IJ SOLN
INTRAMUSCULAR | Status: DC | PRN
  Administered 2011-07-01: 2 mg via INTRAVENOUS

## 2011-07-01 MED ORDER — ONDANSETRON HCL 4 MG/2ML IJ SOLN
INTRAMUSCULAR | Status: DC | PRN
  Administered 2011-07-01: 4 mg via INTRAVENOUS

## 2011-07-01 MED ORDER — ONDANSETRON HCL 4 MG/2ML IJ SOLN
INTRAMUSCULAR | Status: AC
  Filled 2011-07-01: qty 2

## 2011-07-01 MED ORDER — KETOROLAC TROMETHAMINE 30 MG/ML IJ SOLN
INTRAMUSCULAR | Status: AC
  Filled 2011-07-01: qty 1

## 2011-07-01 MED ORDER — SUCCINYLCHOLINE CHLORIDE 20 MG/ML IJ SOLN
INTRAMUSCULAR | Status: AC
  Filled 2011-07-01: qty 1

## 2011-07-01 MED ORDER — HYDROMORPHONE HCL 1 MG/ML IJ SOLN: 0.2500 mg | INTRAMUSCULAR | Status: DC | PRN

## 2011-07-01 MED ORDER — PROPOFOL 10 MG/ML IV EMUL
INTRAVENOUS | Status: AC
  Filled 2011-07-01: qty 20

## 2011-07-01 MED ORDER — ROCURONIUM BROMIDE 100 MG/10ML IV SOLN
INTRAVENOUS | Status: DC | PRN
  Administered 2011-07-01: 5 mg via INTRAVENOUS

## 2011-07-01 MED ORDER — SUCCINYLCHOLINE CHLORIDE 20 MG/ML IJ SOLN
INTRAMUSCULAR | Status: DC | PRN
Start: 2011-07-01 — End: 2011-07-01
  Administered 2011-07-01: 60 mg via INTRAVENOUS
  Administered 2011-07-01: 120 mg via INTRAVENOUS

## 2011-07-01 MED ORDER — FAMOTIDINE 20 MG PO TABS
40.0000 mg | ORAL_TABLET | Freq: Once | ORAL | Status: AC
  Administered 2011-07-01: 40 mg via ORAL
  Filled 2011-07-01: qty 1

## 2011-07-01 MED ORDER — LACTATED RINGERS IV SOLN
INTRAVENOUS | Status: DC
  Administered 2011-07-01: 06:00:00 via INTRAVENOUS

## 2011-07-01 MED ORDER — PROPOFOL 10 MG/ML IV EMUL
INTRAVENOUS | Status: DC | PRN
  Administered 2011-07-01: 40 mg via INTRAVENOUS
  Administered 2011-07-01: 160 mg via INTRAVENOUS

## 2011-07-01 MED ORDER — LIDOCAINE-EPINEPHRINE (PF) 2 %-1:200000 IJ SOLN
INTRAMUSCULAR | Status: AC
  Filled 2011-07-01: qty 20

## 2011-07-01 MED ORDER — METOCLOPRAMIDE HCL 10 MG PO TABS
10.0000 mg | ORAL_TABLET | Freq: Once | ORAL | Status: AC
  Administered 2011-07-01: 10 mg via ORAL
  Filled 2011-07-01: qty 1

## 2011-07-01 MED ORDER — LIDOCAINE-EPINEPHRINE (PF) 2 %-1:200000 IJ SOLN
INTRAMUSCULAR | Status: DC | PRN
  Administered 2011-07-01 (×4): 5 mL via INTRADERMAL

## 2011-07-01 MED ORDER — LIDOCAINE HCL (CARDIAC) 20 MG/ML IV SOLN
INTRAVENOUS | Status: AC
  Filled 2011-07-01: qty 5

## 2011-07-01 MED ORDER — FENTANYL CITRATE 0.05 MG/ML IJ SOLN
INTRAMUSCULAR | Status: AC
  Filled 2011-07-01: qty 2

## 2011-07-01 MED ORDER — BUPIVACAINE HCL (PF) 0.25 % IJ SOLN
INTRAMUSCULAR | Status: DC | PRN
  Administered 2011-07-01: 6 mL

## 2011-07-01 MED ORDER — KETOROLAC TROMETHAMINE 30 MG/ML IJ SOLN: 15.0000 mg | Freq: Once | INTRAMUSCULAR | Status: AC | PRN

## 2011-07-01 SURGICAL SUPPLY — 22 items
BLADE SURG 11 STRL SS (BLADE) ×2 IMPLANT
CATH ROBINSON RED A/P 16FR (CATHETERS) ×2 IMPLANT
CHLORAPREP W/TINT 26ML (MISCELLANEOUS) ×2 IMPLANT
CLIP FILSHIE TUBAL LIGA STRL (Clip) ×2 IMPLANT
CLOTH BEACON ORANGE TIMEOUT ST (SAFETY) ×2 IMPLANT
DRAPE UTILITY XL STRL (DRAPES) ×2 IMPLANT
DRSG COVADERM 4X6 (GAUZE/BANDAGES/DRESSINGS) ×1 IMPLANT
GLOVE BIO SURGEON STRL SZ 6.5 (GLOVE) ×2 IMPLANT
GLOVE BIOGEL PI IND STRL 7.0 (GLOVE) ×2 IMPLANT
GLOVE BIOGEL PI INDICATOR 7.0 (GLOVE) ×2
GOWN PREVENTION PLUS LG XLONG (DISPOSABLE) ×4 IMPLANT
NDL HYPO 25X1 1.5 SAFETY (NEEDLE) IMPLANT
NEEDLE HYPO 25X1 1.5 SAFETY (NEEDLE) IMPLANT
NS IRRIG 1000ML POUR BTL (IV SOLUTION) ×2 IMPLANT
PACK ABDOMINAL MINOR (CUSTOM PROCEDURE TRAY) ×2 IMPLANT
SPONGE LAP 4X18 X RAY DECT (DISPOSABLE) IMPLANT
SUT VIC AB 0 CT1 27 (SUTURE) ×2
SUT VIC AB 0 CT1 27XBRD ANBCTR (SUTURE) ×1 IMPLANT
SUT VIC AB 4-0 PS2 18 (SUTURE) ×2 IMPLANT
SYR CONTROL 10ML LL (SYRINGE) IMPLANT
TOWEL OR 17X24 6PK STRL BLUE (TOWEL DISPOSABLE) ×4 IMPLANT
WATER STERILE IRR 1000ML POUR (IV SOLUTION) ×2 IMPLANT

## 2011-07-01 NOTE — Transfer of Care (Signed)
Immediate Anesthesia Transfer of Care Note  Patient: Alexandra Ball  Procedure(s) Performed:  POST PARTUM TUBAL LIGATION - With Filshie Clips   Patient Location: PACU  Anesthesia Type: General  Level of Consciousness: awake, alert , oriented, patient cooperative and responds to stimulation  Airway & Oxygen Therapy: Patient Spontanous Breathing and Patient connected to nasal cannula oxygen  Post-op Assessment: Report given to PACU RN, Post -op Vital signs rev222iewed and stable and Patient moving all extremities  Post vital signs: Reviewed and stable  Complications: No apparent anesthesia complications

## 2011-07-01 NOTE — Anesthesia Postprocedure Evaluation (Signed)
  Anesthesia Post-op Note  Patient: Alexandra Ball  Procedure(s) Performed:  POST PARTUM TUBAL LIGATION - With Filshie Clips   Patient Location: PACU and Mother/Baby  Anesthesia Type: General  Level of Consciousness: awake, alert , oriented and patient cooperative  Airway and Oxygen Therapy: Patient Spontanous Breathing  Post-op Pain: mild  Post-op Assessment: Post-op Vital signs reviewed  Post-op Vital Signs: Reviewed  Complications: No apparent anesthesia complications

## 2011-07-01 NOTE — Anesthesia Procedure Notes (Addendum)
Performed by: Suella Grove   Procedure Name: Intubation Date/Time: 07/01/2011 9:09 AM Performed by: Suella Grove Pre-anesthesia Checklist: Suction available, Emergency Drugs available, Timeout performed, Patient identified and Patient being monitored Patient Re-evaluated:Patient Re-evaluated prior to inductionOxygen Delivery Method: Circle System Utilized Intubation Type: IV induction and Rapid sequence Laryngoscope Size: Mac and 3 Grade View: Grade II Tube size: 7.0 mm Number of attempts: 1 Placement Confirmation: ETT inserted through vocal cords under direct vision and breath sounds checked- equal and bilateral Secured at: 21 cm Tube secured with: Tape Dental Injury: Teeth and Oropharynx as per pre-operative assessment

## 2011-07-01 NOTE — Anesthesia Postprocedure Evaluation (Signed)
Anesthesia Post Note  Patient: Alexandra Ball  Procedure(s) Performed:  POST PARTUM TUBAL LIGATION - With Filshie Clips   Anesthesia type: General  Patient location: PACU  Post pain: Pain level controlled  Post assessment: Post-op Vital signs reviewed  Last Vitals:  Filed Vitals:   07/01/11 1045  BP: 110/63  Pulse: 70  Temp:   Resp: 18    Post vital signs: Reviewed  Level of consciousness: sedated  Complications: No apparent anesthesia complications

## 2011-07-01 NOTE — Consult Note (Signed)
BREASTFEEDING CONSULTATION SERVICES INFORMATION GIVEN TO PATIENT.  REVIEWED WAKING TECHNIQUES AND BASIC BREASTFEEDING INSTRUCTIONS GIVEN.  ASSISTED PATIENT WITH CROSS CRADLE HOLD AND BABY LATCHED AFTER A FEW ATTEMPTS WITH GOOD SUCK/SWALLOWS.  ENCOURAGED TO CALL WITH QUESTIONS/CONCERNS.

## 2011-07-01 NOTE — Progress Notes (Signed)
UR chart review completed.  

## 2011-07-01 NOTE — Anesthesia Preprocedure Evaluation (Signed)
Anesthesia Evaluation  Name, MR# and DOB Patient awake  General Assessment Comment  Reviewed: Allergy & Precautions, H&P , Patient's Chart, lab work & pertinent test results  Airway Mallampati: II TM Distance: >3 FB Neck ROM: full    Dental No notable dental hx.    Pulmonary  clear to auscultation  pulmonary exam normalPulmonary Exam Normal breath sounds clear to auscultation none    Cardiovascular regular Normal    Neuro/Psych   Seizures -,   Negative Neurological ROS  Negative Psych ROS  GI/Hepatic/Renal negative GI ROS, negative Liver ROS, and negative Renal ROS (+)       Endo/Other  Negative Endocrine ROS (+)      Abdominal   Musculoskeletal   Hematology negative hematology ROS (+)   Peds  Reproductive/Obstetrics (+) Pregnancy    Anesthesia Other Findings             Anesthesia Physical Anesthesia Plan  ASA: II  Anesthesia Plan: Epidural   Post-op Pain Management:    Induction:   Airway Management Planned:   Additional Equipment:   Intra-op Plan:   Post-operative Plan:   Informed Consent: I have reviewed the patients History and Physical, chart, labs and discussed the procedure including the risks, benefits and alternatives for the proposed anesthesia with the patient or authorized representative who has indicated his/her understanding and acceptance.     Plan Discussed with: CRNA and Surgeon  Anesthesia Plan Comments:         Anesthesia Quick Evaluation

## 2011-07-01 NOTE — Progress Notes (Signed)
Post Partum Day 1 Subjective: no complaints, up ad lib, voiding and NPO for tubal.  Would appreciate D/C this PM if possible.  Objective: Blood pressure 105/69, pulse 76, temperature 98 F (36.7 C), temperature source Oral, resp. rate 16, height 5\' 2"  (1.575 m), weight 159 lb (72.122 kg), SpO2 97.00%, unknown if currently breastfeeding.  Physical Exam:  General: alert, cooperative and no distress Lochia: appropriate Uterine Fundus: firm, below umbilicus.  Appropriately tender. Incision: No incision DVT Evaluation: No evidence of DVT seen on physical exam.   Basename 06/30/11 0810  HGB 10.2*  HCT 31.0*    Assessment/Plan: Breastfeeding and Contraception BTL.  D/C this PM vs tomorrow.   LOS: 1 day   Flavio Lindroth 07/01/2011, 7:06 AM

## 2011-07-01 NOTE — Op Note (Signed)
Alexandra Ball 07/01/2011  PREOPERATIVE DIAGNOSIS:  Multiparity, Undesired fertility  POSTOPERATIVE DIAGNOSIS:  Multiparity, Undesired fertility  PROCEDURE:  Postpartum Bilateral Tubal Sterilization using Filshie Clips   SURGEON: Dr. Scheryl Darter, Dr. Maryelizabeth Kaufmann  ANESTHESIA:  General, local 0.25% marcaine  COMPLICATIONS:  None immediate.  ESTIMATED BLOOD LOSS:  Less than 20cc.  FLUIDS: 300 cc LR.  URINE OUTPUT:  50 cc of clear urine.  INDICATIONS: 30 y.o. yo [id number]  with undesired fertility,status post vaginal delivery, desires permanent sterilization. Risks and benefits of procedure discussed with patient including permanence of method, bleeding, infection, injury to surrounding organs and need for additional procedures. Risk failure of 0.5-1% with increased risk of ectopic gestation if pregnancy occurs was also discussed with patient.   FINDINGS:  Normal uterus, tubes, and ovaries.  TECHNIQUE:  The patient was taken to the operating room where her general anesthesia was found to be adequate.  She was then placed in the dorsal supine position and prepped and draped in sterile fashion.  After an adequate timeout was performed, attention was turned to the patient's abdomen where a small transverse skin incision was made under the umbilical fold. The incision was taken down to the layer of fascia using the scalpel, and fascia was incised, and extended bilaterally using Mayo scissors. The peritoneum was entered in a sharp fashion. Attention was then turned to the patient's uterus, and left fallopian tube was identified and followed out to the fimbriated end.  A Filshie clip was placed on the left fallopian tube about 2 cm from the cornual attachment, with care given to incorporate the underlying mesosalpinx.  A similar process was carried out on the rightl side allowing for bilateral tubal sterilization.  Good hemostasis was noted overall.The instruments were then removed from the  patient's abdomen and the fascial incision was repaired with 0 Vicryl, and the skin was closed with a 2-0 Vicryl in a subcuticular stitch. The patient tolerated the procedure well.  Sponge, lap, and needle counts were correct times two.  The patient was then taken to the recovery room awake, extubated and in stable condition.  [name] MD [phone] 07/01/2011

## 2011-07-02 DIAGNOSIS — Z9851 Tubal ligation status: Secondary | ICD-10-CM

## 2011-07-02 LAB — CBC
MCV: 94.6 fL (ref 78.0–100.0)
Platelets: 189 10*3/uL (ref 150–400)
RBC: 2.6 MIL/uL — ABNORMAL LOW (ref 3.87–5.11)
RDW: 14.1 % (ref 11.5–15.5)
WBC: 9.9 10*3/uL (ref 4.0–10.5)

## 2011-07-02 MED ORDER — OXYCODONE-ACETAMINOPHEN 5-325 MG PO TABS: 1.0000 | ORAL_TABLET | ORAL | Status: AC | PRN

## 2011-07-02 MED ORDER — DOCUSATE SODIUM 100 MG PO CAPS: 100.0000 mg | ORAL_CAPSULE | Freq: Two times a day (BID) | ORAL | Status: AC

## 2011-07-02 MED ORDER — IRON 325 (65 FE) MG PO TABS: 325.0000 mg | ORAL_TABLET | Freq: Two times a day (BID) | ORAL | Status: DC

## 2011-07-02 MED ORDER — BENZOCAINE-MENTHOL 20-0.5 % EX AERO: 1.0000 "application " | INHALATION_SPRAY | CUTANEOUS | Status: DC | PRN

## 2011-07-02 MED ORDER — DOCUSATE SODIUM 100 MG PO CAPS: 100.0000 mg | ORAL_CAPSULE | Freq: Two times a day (BID) | ORAL | Status: DC

## 2011-07-02 MED ORDER — IBUPROFEN 600 MG PO TABS: 600.0000 mg | ORAL_TABLET | Freq: Four times a day (QID) | ORAL | Status: DC

## 2011-07-02 MED ORDER — IBUPROFEN 600 MG PO TABS: 600.0000 mg | ORAL_TABLET | Freq: Four times a day (QID) | ORAL | Status: AC

## 2011-07-02 NOTE — Progress Notes (Signed)
Observed latch and was shallow . Assistance with good deep latch. Infant breast fed well for 20 min and then latched on 2nd breast for 10 min. Good swallowing. More teaching on proper breast support and deep latch. Mother very receptive to teaching. inst to hand pump and supplement with with any amt of EBM.  Will follow up again to see next feeding.

## 2011-07-02 NOTE — Discharge Summary (Signed)
Obstetric Discharge Summary Reason for Admission: induction of labor Prenatal Procedures: none Intrapartum Procedures: spontaneous vaginal delivery, preciptuous Postpartum Procedures: P.P. tubal ligation, no complications Complications-Operative and Postpartum: none Hemoglobin  Date Value Range Status  07/02/2011 8.1* 12.0-15.0 (g/dL) Final     DELTA CHECK NOTED     REPEATED TO VERIFY     HCT  Date Value Range Status  07/02/2011 24.6* 36.0-46.0 (%) Final    Discharge Diagnoses: Post-date pregnancy and s/p tubal ligation  Discharge Information: Date: 07/02/2011 Activity: pelvic rest Diet: routine Medications: PNV, Ibuprophen, Colace, Iron and Percocet Condition: stable Instructions: refer to practice specific booklet Discharge to: home Follow-up Information    Follow up with [facility]. (6 weeks for postpartum check)    Contact information:   [address] [place] [address]          Newborn Data: Live born female  Birth Weight: 5 lb 7.7 oz (2486 g) APGAR: 9, 9  Home with mother.  Alexandra Ball 07/02/2011, 7:29 AM

## 2011-07-02 NOTE — Progress Notes (Signed)
Post Partum Day 2 Subjective: no complaints, up ad lib, voiding, tolerating PO, + flatus and small amount of abd pain  Objective: Blood pressure 96/61, pulse 76, temperature 97.9 F (36.6 C), temperature source Oral, resp. rate 18, height 5\' 2"  (1.575 m), weight 159 lb (72.122 kg), SpO2 98.00%, unknown if currently breastfeeding.  Physical Exam:  General: alert, cooperative, appears stated age and no distress Lochia: appropriate CV: RRR Resp: CTABL Uterine Fundus: firm Abdomen: Soft, appropriately tender, no swelling/erythema Incision: no significant drainage, no dehiscence, no significant erythema DVT Evaluation: No evidence of DVT seen on physical exam.   Basename 07/02/11 0515 06/30/11 0810  HGB 8.1* 10.2*  HCT 24.6* 31.0*    Assessment/Plan: Discharge home, Breastfeeding and Contraception s/p BTL Will d/c with iron, motrin, and a small number of percocet.   LOS: 2 days   Radin Raptis 07/02/2011, 7:19 AM

## 2011-07-02 NOTE — Anesthesia Postprocedure Evaluation (Signed)
Anesthesia Post Note  Patient: Alexandra Ball  Procedure(s) Performed: * No procedures listed *  Anesthesia type: Epidural  Patient location: Mother/Baby  Post pain: Pain level controlled  Post assessment: Post-op Vital signs reviewed  Last Vitals:  Filed Vitals:   07/02/11 0600  BP: 96/61  Pulse: 76  Temp: 97.9 F (36.6 C)  Resp: 18    Post vital signs: Reviewed  Level of consciousness: awake  Complications: No apparent anesthesia complications

## 2011-07-07 NOTE — Discharge Summary (Signed)
Agree with above note.  Gailya Tauer 07/07/2011 8:44 AM   

## 2011-07-28 ENCOUNTER — Encounter (HOSPITAL_COMMUNITY): Payer: Self-pay | Admitting: Obstetrics & Gynecology

## 2011-08-18 LAB — BASIC METABOLIC PANEL
Calcium: 9.3
Chloride: 108
Creatinine, Ser: 0.95
GFR calc Af Amer: >60
GFR calc non Af Amer: >60

## 2011-08-18 LAB — DIFFERENTIAL
Basophils Absolute: 0
Basophils Relative: 0
Eosinophils Absolute: 0
Eosinophils Relative: 0
Lymphocytes Relative: 22
Lymphs Abs: 1.9
Monocytes Absolute: 0.6
Monocytes Relative: 7
Neutro Abs: 6.3
Neutrophils Relative %: 71

## 2011-08-18 LAB — CBC
MCV: 95.5
RBC: 4.24
WBC: 8.9

## 2011-08-18 LAB — RAPID URINE DRUG SCREEN, HOSP PERFORMED
Amphetamines: NOT DETECTED
Barbiturates: NOT DETECTED
Benzodiazepines: POSITIVE — AB
Cocaine: NOT DETECTED
Opiates: NOT DETECTED
Tetrahydrocannabinol: POSITIVE — AB

## 2011-08-18 LAB — ETHANOL

## 2011-08-18 LAB — PREGNANCY, URINE: Preg Test, Ur: NEGATIVE

## 2011-10-17 DIAGNOSIS — R51 Headache: Secondary | ICD-10-CM | POA: Insufficient documentation

## 2011-10-17 DIAGNOSIS — F1910 Other psychoactive substance abuse, uncomplicated: Secondary | ICD-10-CM | POA: Insufficient documentation

## 2011-10-17 DIAGNOSIS — R569 Unspecified convulsions: Secondary | ICD-10-CM | POA: Insufficient documentation

## 2011-10-17 DIAGNOSIS — F17200 Nicotine dependence, unspecified, uncomplicated: Secondary | ICD-10-CM | POA: Insufficient documentation

## 2011-10-17 DIAGNOSIS — Z9119 Patient's noncompliance with other medical treatment and regimen: Secondary | ICD-10-CM | POA: Insufficient documentation

## 2011-10-17 DIAGNOSIS — Z91199 Patient's noncompliance with other medical treatment and regimen due to unspecified reason: Secondary | ICD-10-CM | POA: Insufficient documentation

## 2011-10-18 ENCOUNTER — Emergency Department (HOSPITAL_COMMUNITY)
Admission: EM | Admit: 2011-10-18 | Discharge: 2011-10-18 | Discharge status: Against Medical Advice | Payer: Medicaid Other | Attending: Emergency Medicine | Admitting: Emergency Medicine

## 2011-10-18 ENCOUNTER — Encounter (HOSPITAL_COMMUNITY): Payer: Self-pay | Admitting: *Deleted

## 2011-10-18 DIAGNOSIS — R569 Unspecified convulsions: Secondary | ICD-10-CM

## 2011-10-18 DIAGNOSIS — Z9114 Patient's other noncompliance with medication regimen: Secondary | ICD-10-CM

## 2011-10-18 LAB — CBC
HCT: 36.9 % (ref 36.0–46.0)
Hemoglobin: 12.4 g/dL (ref 12.0–15.0)
MCHC: 33.6 g/dL (ref 30.0–36.0)
MCV: 89.1 fL (ref 78.0–100.0)
RDW: 15 % (ref 11.5–15.5)
WBC: 9.4 10*3/uL (ref 4.0–10.5)

## 2011-10-18 LAB — BASIC METABOLIC PANEL
BUN: 9 mg/dL (ref 6–23)
Chloride: 105 mEq/L (ref 96–112)
Creatinine, Ser: 0.7 mg/dL (ref 0.50–1.10)
GFR calc Af Amer: >90 mL/min (ref >90)
Glucose, Bld: 94 mg/dL (ref 70–99)

## 2011-10-18 LAB — URINALYSIS, ROUTINE W REFLEX MICROSCOPIC
Glucose, UA: NEGATIVE mg/dL
Hgb urine dipstick: NEGATIVE
Ketones, ur: NEGATIVE mg/dL
Protein, ur: NEGATIVE mg/dL
pH: 5.5 (ref 5.0–8.0)

## 2011-10-18 MED ORDER — IBUPROFEN 800 MG PO TABS
800.0000 mg | ORAL_TABLET | Freq: Once | ORAL | Status: AC
  Administered 2011-10-18: 800 mg via ORAL
  Filled 2011-10-18: qty 1

## 2011-10-18 MED ORDER — OXYMETAZOLINE HCL 0.05 % NA SOLN
2.0000 | Freq: Once | NASAL | Status: AC
  Administered 2011-10-18: 2 via NASAL
  Filled 2011-10-18: qty 15

## 2011-10-18 MED ORDER — ACETAMINOPHEN 500 MG PO TABS
1000.0000 mg | ORAL_TABLET | Freq: Once | ORAL | Status: AC
  Administered 2011-10-18: 1000 mg via ORAL
  Filled 2011-10-18 (×2): qty 2

## 2011-10-18 NOTE — ED Provider Notes (Signed)
History     CSN: [id number] Arrival date & time: 10/18/2011 12:26 AM   First MD Initiated Contact with Patient 10/18/11 0010      Chief Complaint  Patient presents with  . Seizures    (Consider location/radiation/quality/duration/timing/severity/associated sxs/prior treatment) HPI Comments: Pt states she stopped her dilantin and lamictal 10 to 12 months ago  Patient is a 30 y.o. female presenting with seizures. The history is provided by the patient.  Seizures  This is a recurrent problem. The current episode started 12 to 24 hours ago. The problem has not changed since onset.Number of times: Pt thinks 2, but is not sure. Associated symptoms include headaches. Pertinent negatives include no confusion, no speech difficulty, no chest pain, no cough and no vomiting. Characteristics include rhythmic jerking. Characteristics do not include bowel incontinence or bladder incontinence. The seizures did not continue in the ED. The seizure(s) had no focality. Possible causes include missed seizure meds. There has been no fever. There were no medications administered prior to arrival.    Past Medical History  Diagnosis Date  . Seizures     mild one 6months ago, off meds for pregnancy. Was on lamictil and dilatin  . Substance abuse     Multiple substances    Past Surgical History  Procedure Date  . Cholecystectomy open 2006    during second pregnancy  . Tubal ligation 07/01/2011    Procedure: POST PARTUM TUBAL LIGATION;  Surgeon: Scheryl Darter, MD;  Location: [facility];  Service: Gynecology;  Laterality: Bilateral;  With Filshie Clips     History reviewed. No pertinent family history.  History  Substance Use Topics  . Smoking status: Current Everyday Smoker -- 1.0 packs/day for 10 years    Types: Cigarettes  . Smokeless tobacco: Never Used  . Alcohol Use: Yes     previos hx of drug abuse admitted to behavioral health in 2010    OB History    Grav Para Term Preterm Abortions TAB SAB  Ect Mult Living   6 3 3  0 2 0 2 0 0 3      Review of Systems  Constitutional: Negative for activity change.       All ROS Neg except as noted in HPI  HENT: Negative for nosebleeds and neck pain.   Eyes: Negative for photophobia and discharge.  Respiratory: Negative for cough, shortness of breath and wheezing.   Cardiovascular: Negative for chest pain and palpitations.  Gastrointestinal: Negative for vomiting, abdominal pain, blood in stool and bowel incontinence.  Genitourinary: Negative for bladder incontinence, dysuria, frequency and hematuria.  Musculoskeletal: Negative for back pain and arthralgias.  Skin: Negative.   Neurological: Positive for seizures and headaches. Negative for dizziness and speech difficulty.  Psychiatric/Behavioral: Negative for hallucinations and confusion.    Allergies  Review of patient's allergies indicates no known allergies.  Home Medications   Current Outpatient Rx  Name Route Sig Dispense Refill  . ACETAMINOPHEN 500 MG PO TABS Oral Take 500 mg by mouth every 6 (six) hours as needed. For headache     . BENZOCAINE-MENTHOL 20-0.5 % EX AERO Topical Apply 1 application topically as needed.      . IRON 325 (65 FE) MG PO TABS Oral Take 325 mg by mouth 2 (two) times daily. 84 each 0  . CHILDRENS CHEWABLE VITAMINS PO Oral Take 1 tablet by mouth daily.        BP 117/72  Pulse 88  Temp(Src) 97.7 F (36.5 C) (Oral)  Resp  20  Ht 5\' 2"  (1.575 m)  Wt 155 lb (70.308 kg)  BMI 28.35 kg/m2  SpO2 98%  Physical Exam  Nursing note and vitals reviewed. Constitutional: She is oriented to person, place, and time. She appears well-developed and well-nourished.  Non-toxic appearance.  HENT:  Head: Normocephalic.  Right Ear: Tympanic membrane and external ear normal.  Left Ear: Tympanic membrane and external ear normal.  Eyes: EOM and lids are normal. Pupils are equal, round, and reactive to light.  Neck: Normal range of motion. Neck supple. Carotid bruit is  not present.  Cardiovascular: Normal rate, regular rhythm, normal heart sounds, intact distal pulses and normal pulses.   Pulmonary/Chest: Breath sounds normal. No respiratory distress.  Abdominal: Soft. Bowel sounds are normal. There is no tenderness. There is no guarding.  Musculoskeletal: Normal range of motion.  Lymphadenopathy:       Head (right side): No submandibular adenopathy present.       Head (left side): No submandibular adenopathy present.    She has no cervical adenopathy.  Neurological: She is alert and oriented to person, place, and time. She has normal strength. No cranial nerve deficit or sensory deficit. She exhibits normal muscle tone. Coordination and gait normal. GCS eye subscore is 4. GCS verbal subscore is 5. GCS motor subscore is 6.  Skin: Skin is warm and dry.  Psychiatric: She has a normal mood and affect. Her speech is normal.    ED Course: Plan to check UA, CBC, and BMET to r/o additonal source of seizure discussed. After exam pt not found in room. 2:10 pt still not in room   Procedures (including critical care time)   Labs Reviewed  URINALYSIS, ROUTINE W REFLEX MICROSCOPIC  CBC  BASIC METABOLIC PANEL   No results found.   Dx: 1. Medication Non-compliance  2. Seizures   3. Leaving AMA   MDM  Pt left AMA        Kathie Dike, [place] 10/18/11 [phone]

## 2011-10-18 NOTE — ED Notes (Signed)
  Seizures started at 11pm with headache to follow; hx of seizures stopped taking meds due to pregnacy

## 2011-10-18 NOTE — ED Provider Notes (Signed)
Medical screening examination/treatment/procedure(s) were performed by non-physician practitioner and as supervising physician I was immediately available for consultation/collaboration.   Hanley Seamen, MD 10/18/11 [phone]

## 2011-10-18 NOTE — ED Notes (Signed)
Pt states seizures today unsure of number. Stopped taking seizure meds prior to pregnancy. Pt now 3 mths post partum has not started taking seizure meds at this time

## 2011-10-18 NOTE — ED Notes (Signed)
AT 0140 NT approached RN to ask where pt was; pt not in room. After investigating pt had left the ED. Pt did not let medical staff know that she was leaving. Last time this RN seen pt was at 0100. Pt did not appear upset, medications had been given, RN and MD had assessed. MD notified of pt leaving. No AMA signature. No staff aware of pt leaving

## 2012-03-06 ENCOUNTER — Encounter (HOSPITAL_COMMUNITY): Payer: Self-pay | Admitting: *Deleted

## 2012-03-06 ENCOUNTER — Emergency Department (HOSPITAL_COMMUNITY): Payer: Medicaid Other

## 2012-03-06 ENCOUNTER — Emergency Department (HOSPITAL_COMMUNITY)
Admission: EM | Admit: 2012-03-06 | Discharge: 2012-03-06 | Disposition: A | Discharge status: Home / Self Care | Payer: Medicaid Other | Attending: Emergency Medicine | Admitting: Emergency Medicine

## 2012-03-06 DIAGNOSIS — M79609 Pain in unspecified limb: Secondary | ICD-10-CM | POA: Insufficient documentation

## 2012-03-06 DIAGNOSIS — R569 Unspecified convulsions: Secondary | ICD-10-CM | POA: Insufficient documentation

## 2012-03-06 DIAGNOSIS — W230XXA Caught, crushed, jammed, or pinched between moving objects, initial encounter: Secondary | ICD-10-CM | POA: Insufficient documentation

## 2012-03-06 DIAGNOSIS — S60229A Contusion of unspecified hand, initial encounter: Secondary | ICD-10-CM | POA: Insufficient documentation

## 2012-03-06 DIAGNOSIS — F17200 Nicotine dependence, unspecified, uncomplicated: Secondary | ICD-10-CM | POA: Insufficient documentation

## 2012-03-06 HISTORY — DX: Carpal tunnel syndrome, unspecified upper limb: G56.00

## 2012-03-06 MED ORDER — IBUPROFEN 800 MG PO TABS
800.0000 mg | ORAL_TABLET | Freq: Once | ORAL | Status: AC
  Administered 2012-03-06: 800 mg via ORAL
  Filled 2012-03-06: qty 1

## 2012-03-06 MED ORDER — OXYCODONE-ACETAMINOPHEN 5-325 MG PO TABS
1.0000 | ORAL_TABLET | Freq: Once | ORAL | Status: AC
  Administered 2012-03-06: 1 via ORAL
  Filled 2012-03-06: qty 1

## 2012-03-06 MED ORDER — OXYCODONE-ACETAMINOPHEN 5-325 MG PO TABS: 1.0000 | ORAL_TABLET | ORAL | Status: AC | PRN

## 2012-03-06 MED ORDER — IBUPROFEN 800 MG PO TABS: 800.0000 mg | ORAL_TABLET | Freq: Three times a day (TID) | ORAL | Status: AC

## 2012-03-06 NOTE — ED Provider Notes (Signed)
History     CSN: [id number]  Arrival date & time 03/06/12  2010   First MD Initiated Contact with Patient 03/06/12 2032      Chief Complaint  Patient presents with  . Hand Pain    left    (Consider location/radiation/quality/duration/timing/severity/associated sxs/prior treatment) HPI Comments: Patient c/o pain to her left hand that began suddenly after she shut a car door on her hand.  She also states she has carpel tunnel in her left hand and wrist and she states that she has intermittent hand pain prior to the injury.  She denies numbness and weakness   Patient is a 31 y.o. female presenting with hand pain. The history is provided by the patient.  Hand Pain This is a new problem. The current episode started today. The problem occurs constantly. The problem has been unchanged. Associated symptoms include arthralgias. Pertinent negatives include no joint swelling, numbness, rash or weakness. Exacerbated by: movement and palpation. She has tried nothing for the symptoms. The treatment provided no relief.    Past Medical History  Diagnosis Date  . Seizures     mild one 6months ago, off meds for pregnancy. Was on lamictil and dilatin  . Substance abuse     Multiple substances  . Carpal tunnel syndrome     Past Surgical History  Procedure Date  . Cholecystectomy open 2006    during second pregnancy  . Tubal ligation 07/01/2011    Procedure: POST PARTUM TUBAL LIGATION;  Surgeon: Scheryl Darter, MD;  Location: [facility];  Service: Gynecology;  Laterality: Bilateral;  With Filshie Clips   . Ovarian cyst removal     History reviewed. No pertinent family history.  History  Substance Use Topics  . Smoking status: Current Everyday Smoker -- 1.0 packs/day for 10 years    Types: Cigarettes  . Smokeless tobacco: Never Used  . Alcohol Use: Yes     previos hx of drug abuse admitted to behavioral health in 2010; states occasionally on 03/06/12    OB History    Grav Para Term Preterm  Abortions TAB SAB Ect Mult Living   6 3 3  0 2 0 2 0 0 3      Review of Systems  Musculoskeletal: Positive for arthralgias. Negative for joint swelling.  Skin: Negative for rash.  Neurological: Negative for weakness and numbness.  All other systems reviewed and are negative.    Allergies  Ultram  Home Medications   Current Outpatient Rx  Name Route Sig Dispense Refill  . ACETAMINOPHEN 500 MG PO TABS Oral Take 500 mg by mouth every 6 (six) hours as needed. For headache     . BENZOCAINE-MENTHOL 20-0.5 % EX AERO Topical Apply 1 application topically as needed.      . IRON 325 (65 FE) MG PO TABS Oral Take 325 mg by mouth 2 (two) times daily. 84 each 0  . CHILDRENS CHEWABLE VITAMINS PO Oral Take 1 tablet by mouth daily.        BP 137/82  Pulse 87  Temp(Src) 98.5 F (36.9 C) (Oral)  Resp 22  Ht 5\' 2"  (1.575 m)  Wt 145 lb (65.772 kg)  BMI 26.52 kg/m2  SpO2 100%  LMP 03/04/2012  Physical Exam  Nursing note and vitals reviewed. Constitutional: She is oriented to person, place, and time. She appears well-developed and well-nourished. No distress.  HENT:  Head: Normocephalic and atraumatic.  Cardiovascular: Normal rate, normal heart sounds and intact distal pulses.   Pulmonary/Chest: Effort  normal and breath sounds normal.  Musculoskeletal: She exhibits tenderness. She exhibits no edema.       Left hand: She exhibits tenderness. She exhibits normal range of motion, no bony tenderness, normal two-point discrimination, normal capillary refill, no deformity, no laceration and no swelling. normal sensation noted. Normal strength noted.       Hands:      ttp of the dorsal left hand.  No edema.  Sensation intact, radial pulse is brisk, CR< 2 sec  Neurological: She is alert and oriented to person, place, and time. She exhibits normal muscle tone. Coordination normal.  Skin: Skin is warm and dry.    ED Course  Procedures (including critical care time)  Dg Hand Complete  Left  03/06/2012  *RADIOLOGY REPORT*  Clinical Data: Crush injury to the left hand, with pain about the fifth metacarpal.  LEFT HAND - COMPLETE 3+ VIEW  Comparison: Left wrist radiographs performed 02/23/2012  Findings: There is no evidence of fracture or dislocation.  The joint spaces are preserved; the soft tissues are unremarkable in appearance.  The carpal rows are intact, and demonstrate normal alignment.  IMPRESSION: No evidence of fracture or dislocation.  Original Report Authenticated By: Tonia Ghent, M.D.      Jonell Cluck dressing was applied to the left hand. Pain is improved. She remains neurovascularly intact  MDM      Tenderness to palpation along the dorsal surface of the left hand along the area of the fourth and fifth metacarpals No deformity, bruising, or edema of the hand.  Left radial pulse is brisk, sensation is intact, capillary refill is less than 2 seconds.    Patient / Family / Caregiver understand and agree with initial ED impression and plan with expectations set for ED visit. Pt stable in ED with no significant deterioration in condition. Pt feels improved after observation and/or treatment in ED.    Shamon Lobo L. [place], [place] 03/10/12 2126

## 2012-03-06 NOTE — Discharge Instructions (Signed)
Contusion (Bruise) of Hand  An injury to the hand may cause bruises (contusions). Contusions are caused by bleeding from small blood vessels (capillaries) that allow blood to leak out into the muscles, tendons, and surrounding soft tissue. This is followed by swelling and pain (inflammation). Contusions of the hand are common because of the use of hands in daily and recreational activities. Signs of a hand injury include pain, swelling, and a color change. Initially the skin may turn blue to purple in color. As the bruise ages, the color turns yellow and orange. Swelling may decrease the movement of the fingers. Contusions are seen more commonly with:   Contact sports (especially in football, wrestling, and basketball).   Use of medications that thin the blood (anticoagulants).   Use of aspirin and nonsteroidal anti-inflammatory agents that decrease the ability of the blood to clot.   Vitamin deficiencies.   Aging.  DIAGNOSIS   Diagnosis of hand injuries can be made by your own observation. If problems continue, a caregiver may be required for further evaluation and treatment. X-rays may be required to make sure there are no broken bones (fractures). Continued problems may require physical therapy for treatment.  RISKS AND COMPLICATIONS   Extensive bleeding and tissue inflammation. This can lead to disability and arthritis-type problems later on if the hand does not heal properly.   Infection of the hand if there are breaks in the skin. This is especially true if the hand injury came from someone's teeth, such as would occur with punching someone in the mouth. This can lead to an infection of the tendons and the membranes surrounding the tendons (sheaths). This infection can have severe complications including a loss of function (a "frozen" hand).   Rupture of the tendons requiring a surgical repair. Failure to repair the tendons can result in loss of function of the hand or fingers.  HOME CARE INSTRUCTIONS     Apply ice to the injury for 15 to 20 minutes, 3 to 4 times per day. Put the ice in a plastic bag and place a towel between the bag of ice and your skin.   An elastic bandage may be used initially for support and to minimize swelling. Do not wrap the hand too tightly. Do not sleep with the elastic bandage on.   Gentle massage from the fingertips towards the elbow will help keep the swelling down. Gently open and close your fist while doing this to maintain range of motion. Do this only after the first few days, when there is no or minimal pain.   Keep your hand above the level of the heart when swelling and pain are present. This will allow the fluid to drain out of the hand, decreasing the amount of swelling. This will improve healing time.   Try to avoid use of the injured hand (except for gentle range of motion) while the hand is hurting. Do not resume use until instructed by your caregiver. Then begin use gradually, do not increase use to the point of pain. If pain does develop, decrease use and continue the above measures, gradually increasing activities that do not cause discomfort until you achieve normal use.   Only take over-the-counter or prescription medicines for pain, discomfort, or fever as directed by your caregiver.   Follow up with your caregiver as directed. Follow-up care may include orthopedic referrals, physical therapy, and rehabilitation. Any delay in obtaining necessary care could result in delayed healing, or temporary or permanent disability.  REHABILITATION     Begin daily rehabilitation exercises when an elastic bandage is no longer needed and you are either pain free or only have minimal pain.   Use ice massage for 10 minutes before and after workouts. Put ice in a plastic bag and place a towel between the bag of ice and your skin. Massage the injured area with the ice pack.  SEEK IMMEDIATE MEDICAL CARE IF:    Your pain and swelling increase, or pain is uncontrolled with  medications.   You have loss of feeling in your hand, or your hand turns cold or blue.   An oral temperature above 102 F (38.9 C) develops, not controlled by medication.   Your hand becomes warm to the touch, or you have increased pain with even slight movement of your fingers.   Your hand does not begin to improve in 1 or 2 days.   The skin is broken and signs of infection occur (fluid draining from the contusion, increasing pain, fever, headache, muscle aches, dizziness, or a general ill feeling).   You develop new, unexplained problems, or an increase of the symptoms that brought you to your caregiver.  MAKE SURE YOU:    Understand these instructions.   Will watch your condition.   Will get help right away if you are not doing well or get worse.  Document Released: 05/01/2002 Document Revised: 10/29/2011 Document Reviewed: 04/18/2010  [employer] Patient Information 2012 [employer].

## 2012-03-06 NOTE — ED Notes (Signed)
C/o left hand pain; states hand was closed in door.

## 2012-03-06 NOTE — ED Notes (Signed)
States that she accidentally had door slammed into her left hand, c/o severe pain, states that she can't wiggle her fingers without severe pain

## 2012-03-10 NOTE — ED Provider Notes (Signed)
Medical screening examination/treatment/procedure(s) were performed by non-physician practitioner and as supervising physician I was immediately available for consultation/collaboration.  Shelda Jakes, MD 03/10/12 [phone]

## 2012-07-05 ENCOUNTER — Encounter (HOSPITAL_COMMUNITY): Payer: Self-pay | Admitting: *Deleted

## 2012-07-05 ENCOUNTER — Emergency Department (HOSPITAL_COMMUNITY)
Admission: EM | Admit: 2012-07-05 | Discharge: 2012-07-05 | Disposition: A | Discharge status: Home / Self Care | Payer: Medicaid Other | Attending: Emergency Medicine | Admitting: Emergency Medicine

## 2012-07-05 DIAGNOSIS — N390 Urinary tract infection, site not specified: Secondary | ICD-10-CM

## 2012-07-05 DIAGNOSIS — G5600 Carpal tunnel syndrome, unspecified upper limb: Secondary | ICD-10-CM | POA: Insufficient documentation

## 2012-07-05 DIAGNOSIS — F17200 Nicotine dependence, unspecified, uncomplicated: Secondary | ICD-10-CM | POA: Insufficient documentation

## 2012-07-05 DIAGNOSIS — M549 Dorsalgia, unspecified: Secondary | ICD-10-CM | POA: Insufficient documentation

## 2012-07-05 LAB — CBC WITH DIFFERENTIAL/PLATELET
Basophils Relative: 0 % (ref 0–1)
HCT: 39.3 % (ref 36.0–46.0)
Hemoglobin: 13.5 g/dL (ref 12.0–15.0)
Lymphocytes Relative: 22 % (ref 12–46)
MCHC: 34.4 g/dL (ref 30.0–36.0)
Monocytes Absolute: 1.1 10*3/uL — ABNORMAL HIGH (ref 0.1–1.0)
Monocytes Relative: 14 % — ABNORMAL HIGH (ref 3–12)
Neutro Abs: 5.1 10*3/uL (ref 1.7–7.7)

## 2012-07-05 LAB — URINALYSIS, ROUTINE W REFLEX MICROSCOPIC
Bilirubin Urine: NEGATIVE
Glucose, UA: NEGATIVE mg/dL
Ketones, ur: NEGATIVE mg/dL
pH: 6 (ref 5.0–8.0)

## 2012-07-05 LAB — BASIC METABOLIC PANEL
BUN: 4 mg/dL — ABNORMAL LOW (ref 6–23)
CO2: 25 mEq/L (ref 19–32)
Chloride: 103 mEq/L (ref 96–112)
Creatinine, Ser: 0.82 mg/dL (ref 0.50–1.10)
Glucose, Bld: 93 mg/dL (ref 70–99)

## 2012-07-05 LAB — URINE MICROSCOPIC-ADD ON

## 2012-07-05 MED ORDER — PROMETHAZINE HCL 25 MG PO TABS: 25.0000 mg | ORAL_TABLET | Freq: Four times a day (QID) | ORAL | Status: DC | PRN

## 2012-07-05 MED ORDER — CIPROFLOXACIN HCL 500 MG PO TABS: 500.0000 mg | ORAL_TABLET | Freq: Two times a day (BID) | ORAL | Status: AC

## 2012-07-05 MED ORDER — SODIUM CHLORIDE 0.9 % IV SOLN: Freq: Once | INTRAVENOUS | Status: DC

## 2012-07-05 MED ORDER — HYDROCODONE-ACETAMINOPHEN 5-325 MG PO TABS: 1.0000 | ORAL_TABLET | Freq: Four times a day (QID) | ORAL | Status: AC | PRN

## 2012-07-05 MED ORDER — HYDROMORPHONE HCL PF 1 MG/ML IJ SOLN
1.0000 mg | Freq: Once | INTRAMUSCULAR | Status: DC
  Filled 2012-07-05: qty 1

## 2012-07-05 MED ORDER — ONDANSETRON HCL 4 MG/2ML IJ SOLN
4.0000 mg | Freq: Once | INTRAMUSCULAR | Status: DC
  Filled 2012-07-05: qty 2

## 2012-07-05 MED ORDER — OXYCODONE-ACETAMINOPHEN 5-325 MG PO TABS
1.0000 | ORAL_TABLET | Freq: Once | ORAL | Status: AC
  Administered 2012-07-05: 1 via ORAL
  Filled 2012-07-05: qty 1

## 2012-07-05 MED ORDER — CIPROFLOXACIN HCL 250 MG PO TABS
500.0000 mg | ORAL_TABLET | Freq: Once | ORAL | Status: AC
  Administered 2012-07-05: 500 mg via ORAL
  Filled 2012-07-05: qty 2

## 2012-07-05 NOTE — ED Provider Notes (Signed)
History   This chart was scribed for Alexandra Lennert, MD by [name] . The patient was seen in room [facility]18/[facility]18. Patient's care was started at 1143.    CSN: [id number]  Arrival date & time 07/05/12  1059   First MD Initiated Contact with Patient 07/05/12 1143      Chief Complaint  Patient presents with  . Back Pain    (Consider location/radiation/quality/duration/timing/severity/associated sxs/prior treatment) HPI Comments: Alexandra Ball is a 31 y.o. female who presents to the Emergency Department complaining of constant, moderate right lower back for the past week. Pt reports associated urinary urgency and frequency. Pt reports that she also has an associated fever that started this morning. Temperature here in ED is 98.6. Pt denies any n/v, dysuria and abdominal pain. Pt reports a h/o UTI but does not remember her last one.   Patient is a 31 y.o. female presenting with flank pain. The history is provided by the patient.  Flank Pain This is a new problem. The current episode started more than 2 days ago. The problem occurs constantly. The problem has been gradually worsening. Pertinent negatives include no chest pain, no abdominal pain and no headaches. Nothing aggravates the symptoms. Nothing relieves the symptoms. She has tried nothing for the symptoms.     Past Medical History  Diagnosis Date  . Seizures     mild one 6months ago, off meds for pregnancy. Was on lamictil and dilatin  . Substance abuse     Multiple substances  . Carpal tunnel syndrome     Past Surgical History  Procedure Date  . Cholecystectomy open 2006    during second pregnancy  . Tubal ligation 07/01/2011    Procedure: POST PARTUM TUBAL LIGATION;  Surgeon: Scheryl Darter, MD;  Location: [facility];  Service: Gynecology;  Laterality: Bilateral;  With Filshie Clips   . Ovarian cyst removal     History reviewed. No pertinent family history.  History  Substance Use Topics  . Smoking status:  Current Everyday Smoker -- 1.0 packs/day for 10 years    Types: Cigarettes  . Smokeless tobacco: Never Used  . Alcohol Use: Yes     previos hx of drug abuse admitted to behavioral health in 2010; states occasionally on 03/06/12    OB History    Grav Para Term Preterm Abortions TAB SAB Ect Mult Living   6 3 3  0 2 0 2 0 0 3      Review of Systems  Constitutional: Positive for fever. Negative for fatigue.  HENT: Negative for congestion, sinus pressure and ear discharge.   Eyes: Negative for discharge.  Respiratory: Negative for cough.   Cardiovascular: Negative for chest pain.  Gastrointestinal: Negative for nausea, vomiting, abdominal pain and diarrhea.  Genitourinary: Positive for urgency, frequency and flank pain. Negative for hematuria.  Musculoskeletal: Positive for back pain.  Skin: Negative for rash.  Neurological: Negative for seizures and headaches.  Hematological: Negative.   Psychiatric/Behavioral: Negative for hallucinations.  All other systems reviewed and are negative.    Allergies  Ultram  Home Medications   Current Outpatient Rx  Name Route Sig Dispense Refill  . IBUPROFEN 200 MG PO TABS Oral Take 400 mg by mouth every 6 (six) hours as needed. Fever      BP 107/72  Pulse 120  Temp 98.6 F (37 C) (Oral)  Resp 16  Ht 5\' 2"  (1.575 m)  Wt 140 lb (63.504 kg)  BMI 25.61 kg/m2  SpO2 96%  LMP 06/27/2012  Physical Exam  Constitutional: She is oriented to person, place, and time. She appears well-developed.  HENT:  Head: Normocephalic and atraumatic.  Eyes: Conjunctivae and EOM are normal. No scleral icterus.  Neck: Neck supple. No thyromegaly present.  Cardiovascular: Normal rate, regular rhythm and normal heart sounds.  Exam reveals no gallop and no friction rub.   No murmur heard. Pulmonary/Chest: Effort normal and breath sounds normal. No stridor. She has no wheezes. She has no rales. She exhibits no tenderness.  Abdominal: Soft. Bowel sounds are  normal. She exhibits no distension. There is tenderness. There is no rebound.       Moderate right flank tenderness.    Musculoskeletal: Normal range of motion. She exhibits no edema.  Lymphadenopathy:    She has no cervical adenopathy.  Neurological: She is oriented to person, place, and time. Coordination normal.  Skin: No rash noted. No erythema.  Psychiatric: She has a normal mood and affect. Her behavior is normal.    ED Course  Procedures (including critical care time)  DIAGNOSTIC STUDIES: Oxygen Saturation is 96% on room air, normal by my interpretation.    COORDINATION OF CARE:  11:51-Discussed planned course of treatment with the patient including UA and blood work, who is agreeable at this time.   12:00-Medication Orders: Hydromorphone (Dilaudid) injection 1 mg-once; Ondansetron (Zofran) injection 4 mg-once; 0.9% sodium chloride infusion-once.   Results for orders placed during the hospital encounter of 07/05/12  URINALYSIS, ROUTINE W REFLEX MICROSCOPIC      Component Value Range   Color, Urine YELLOW  YELLOW   APPearance CLEAR  CLEAR   Specific Gravity, Urine 1.015  1.005 - 1.030   pH 6.0  5.0 - 8.0   Glucose, UA NEGATIVE  NEGATIVE mg/dL   Hgb urine dipstick NEGATIVE  NEGATIVE   Bilirubin Urine NEGATIVE  NEGATIVE   Ketones, ur NEGATIVE  NEGATIVE mg/dL   Protein, ur NEGATIVE  NEGATIVE mg/dL   Urobilinogen, UA 0.2  0.0 - 1.0 mg/dL   Nitrite POSITIVE (*) NEGATIVE   Leukocytes, UA SMALL (*) NEGATIVE  CBC WITH DIFFERENTIAL      Component Value Range   WBC 7.9  4.0 - 10.5 K/uL   RBC 4.29  3.87 - 5.11 MIL/uL   Hemoglobin 13.5  12.0 - 15.0 g/dL   HCT 84.6  96.2 - 95.2 %   MCV 91.6  78.0 - 100.0 fL   MCH 31.5  26.0 - 34.0 pg   MCHC 34.4  30.0 - 36.0 g/dL   RDW 84.1  32.4 - 40.1 %   Platelets 189  150 - 400 K/uL   Neutrophils Relative 64  43 - 77 %   Neutro Abs 5.1  1.7 - 7.7 K/uL   Lymphocytes Relative 22  12 - 46 %   Lymphs Abs 1.8  0.7 - 4.0 K/uL   Monocytes  Relative 14 (*) 3 - 12 %   Monocytes Absolute 1.1 (*) 0.1 - 1.0 K/uL   Eosinophils Relative 0  0 - 5 %   Eosinophils Absolute 0.0  0.0 - 0.7 K/uL   Basophils Relative 0  0 - 1 %   Basophils Absolute 0.0  0.0 - 0.1 K/uL  BASIC METABOLIC PANEL      Component Value Range   Sodium 139  135 - 145 mEq/L   Potassium 3.2 (*) 3.5 - 5.1 mEq/L   Chloride 103  96 - 112 mEq/L   CO2 25  19 - 32 mEq/L  Glucose, Bld 93  70 - 99 mg/dL   BUN 4 (*) 6 - 23 mg/dL   Creatinine, Ser 1.61  0.50 - 1.10 mg/dL   Calcium 9.5  8.4 - 09.6 mg/dL   GFR calc non Af Amer >90  >90 mL/min   GFR calc Af Amer >90  >90 mL/min  PREGNANCY, URINE      Component Value Range   Preg Test, Ur NEGATIVE  NEGATIVE  URINE MICROSCOPIC-ADD ON      Component Value Range   Squamous Epithelial / LPF FEW (*) RARE   WBC, UA 7-10  <3 WBC/hpf   Bacteria, UA MANY (*) RARE     No results found.   No diagnosis found.    MDM    The chart was scribed for me under my direct supervision.  I personally performed the history, physical, and medical decision making and all procedures in the evaluation of this patient.Alexandra Lennert, MD 07/05/12 [phone]

## 2012-07-05 NOTE — ED Notes (Signed)
Pt c/o right lower back pain, urinary frequency and urgency, and fever. Pt states that the pain and urinary problems started last week but fever started today. Pt denies nausea and vomiting.

## 2012-07-05 NOTE — ED Notes (Signed)
Pt was being stuck for IV and requests that I take it out and not start it. Pt states that she would rather have a shot. Family at bedside.

## 2012-07-05 NOTE — ED Notes (Signed)
Pt alert and oriented x 3. Skin warm and dry. Color pink. Breath sounds clear and equal bilaterally. C/o pain in her right lower back. No acute distress.

## 2012-07-09 ENCOUNTER — Emergency Department (HOSPITAL_COMMUNITY)
Admission: EM | Admit: 2012-07-09 | Discharge: 2012-07-09 | Disposition: A | Discharge status: Home / Self Care | Payer: Medicaid Other | Attending: Emergency Medicine | Admitting: Emergency Medicine

## 2012-07-09 ENCOUNTER — Encounter (HOSPITAL_COMMUNITY): Payer: Self-pay | Admitting: *Deleted

## 2012-07-09 DIAGNOSIS — R569 Unspecified convulsions: Secondary | ICD-10-CM | POA: Insufficient documentation

## 2012-07-09 DIAGNOSIS — A5903 Trichomonal cystitis and urethritis: Secondary | ICD-10-CM

## 2012-07-09 DIAGNOSIS — M545 Low back pain, unspecified: Secondary | ICD-10-CM | POA: Insufficient documentation

## 2012-07-09 DIAGNOSIS — F17200 Nicotine dependence, unspecified, uncomplicated: Secondary | ICD-10-CM | POA: Insufficient documentation

## 2012-07-09 DIAGNOSIS — J069 Acute upper respiratory infection, unspecified: Secondary | ICD-10-CM

## 2012-07-09 DIAGNOSIS — Z888 Allergy status to other drugs, medicaments and biological substances status: Secondary | ICD-10-CM | POA: Insufficient documentation

## 2012-07-09 DIAGNOSIS — F1910 Other psychoactive substance abuse, uncomplicated: Secondary | ICD-10-CM | POA: Insufficient documentation

## 2012-07-09 DIAGNOSIS — F319 Bipolar disorder, unspecified: Secondary | ICD-10-CM | POA: Insufficient documentation

## 2012-07-09 DIAGNOSIS — G5600 Carpal tunnel syndrome, unspecified upper limb: Secondary | ICD-10-CM | POA: Insufficient documentation

## 2012-07-09 DIAGNOSIS — A5909 Other urogenital trichomoniasis: Secondary | ICD-10-CM | POA: Insufficient documentation

## 2012-07-09 HISTORY — DX: Bipolar disorder, unspecified: F31.9

## 2012-07-09 LAB — URINE MICROSCOPIC-ADD ON

## 2012-07-09 LAB — URINALYSIS, ROUTINE W REFLEX MICROSCOPIC
Glucose, UA: NEGATIVE mg/dL
Hgb urine dipstick: NEGATIVE
Ketones, ur: NEGATIVE mg/dL
Protein, ur: NEGATIVE mg/dL

## 2012-07-09 MED ORDER — HYDROCODONE-ACETAMINOPHEN 5-325 MG PO TABS
ORAL_TABLET | ORAL | Status: AC
Start: 2012-07-09 — End: 2012-07-19

## 2012-07-09 MED ORDER — METRONIDAZOLE 500 MG PO TABS
2000.0000 mg | ORAL_TABLET | Freq: Once | ORAL | Status: AC
  Administered 2012-07-09: 2000 mg via ORAL
  Filled 2012-07-09: qty 4

## 2012-07-09 NOTE — ED Notes (Signed)
Pt c/o left flank pain, fever, sorethroat and bilateral earaches. Pt seen in ED on Tuesday and diagnosed with bladder infection. States that her fever has gotten worse. States that she has been taking her antibiotics. Took tylenol approximately 10 am.

## 2012-07-09 NOTE — ED Provider Notes (Signed)
History     CSN: [id number]  Arrival date & time 07/09/12  1134   First MD Initiated Contact with Patient 07/09/12 1224      Chief Complaint  Patient presents with  . Flank Pain     HPI Pt was seen at 1305.  Per pt, c/o gradual onset and persistence of constant right lower back "pain" for the past several days.  Has been associated with home fevers to "103."  States she was eval in the ED 4 days ago for same, dx UTI, rx abx (cipro).  Endorses she has been taking the antibiotic with improvement in her urinary frequency, urgency and dysuria.  Pt also c/o gradual onset and persistence of constant sore throat and bilat ears "pain" for the past several days.  Endorses her children have the same symptoms, dx virus.  Denies cough/SOB, no CP/palpitations, no abd pain, no N/V/D, no rash, no vaginal bleeding/discharge.      Past Medical History  Diagnosis Date  . Seizures     mild one 6months ago, off meds for pregnancy. Was on lamictil and dilatin  . Substance abuse     Multiple substances  . Carpal tunnel syndrome   . Bipolar disorder     Past Surgical History  Procedure Date  . Cholecystectomy open 2006    during second pregnancy  . Tubal ligation 07/01/2011    Procedure: POST PARTUM TUBAL LIGATION;  Surgeon: Scheryl Darter, MD;  Location: [facility];  Service: Gynecology;  Laterality: Bilateral;  With Filshie Clips   . Ovarian cyst removal     History  Substance Use Topics  . Smoking status: Current Everyday Smoker -- 1.0 packs/day for 10 years    Types: Cigarettes  . Smokeless tobacco: Never Used  . Alcohol Use: Yes     previos hx of drug abuse admitted to behavioral health in 2010; states occasionally on 03/06/12    OB History    Grav Para Term Preterm Abortions TAB SAB Ect Mult Living   6 3 3  0 2 0 2 0 0 3      Review of Systems ROS: Statement: All systems negative except as marked or noted in the HPI; Constitutional: Negative for fever and chills. ; ; Eyes: Negative for eye  pain, redness and discharge. ; ; ENMT: Negative for hoarseness, nasal congestion, sinus pressure and +ears pain, sore throat. ; ; Cardiovascular: Negative for chest pain, palpitations, diaphoresis, dyspnea and peripheral edema. ; ; Respiratory: Negative for cough, wheezing and stridor. ; ; Gastrointestinal: Negative for nausea, vomiting, diarrhea, abdominal pain, blood in stool, hematemesis, jaundice and rectal bleeding. . ; ; Genitourinary: Negative for dysuria, flank pain and hematuria. ; ; GYN:  No vaginal bleeding, no vaginal discharge, no vulvar pain. ;; Musculoskeletal: +LBP. Negative for neck pain. Negative for swelling and trauma.; ; Skin: Negative for pruritus, rash, abrasions, blisters, bruising and skin lesion.; ; Neuro: Negative for headache, lightheadedness and neck stiffness. Negative for weakness, altered level of consciousness , altered mental status, extremity weakness, paresthesias, involuntary movement, seizure and syncope.       Allergies  Ultram  Home Medications   Current Outpatient Rx  Name Route Sig Dispense Refill  . CIPROFLOXACIN HCL 500 MG PO TABS Oral Take 1 tablet (500 mg total) by mouth every 12 (twelve) hours. 14 tablet 0  . HYDROCODONE-ACETAMINOPHEN 5-325 MG PO TABS Oral Take 1 tablet by mouth every 6 (six) hours as needed for pain. 20 tablet 0  . IBUPROFEN 200  MG PO TABS Oral Take 400 mg by mouth every 6 (six) hours as needed. Fever    . PROMETHAZINE HCL 25 MG PO TABS Oral Take 1 tablet (25 mg total) by mouth every 6 (six) hours as needed for nausea. 15 tablet 0    BP 133/96  Pulse 111  Temp 98.6 F (37 C) (Oral)  Resp 20  Ht 5\' 2"  (1.575 m)  Wt 140 lb (63.504 kg)  BMI 25.61 kg/m2  SpO2 96%  LMP 06/27/2012  Physical Exam 1310: Physical examination:  Nursing notes reviewed; Vital signs and O2 SAT reviewed;  Constitutional: Well developed, Well nourished, Well hydrated, In no acute distress; Head:  Normocephalic, atraumatic; Eyes: EOMI, PERRL, No scleral  icterus; ENMT: TM's clear bilat. +mild post pharyngeal erythema.  No intra-oral lesions, no intra-oral edema, no hoarse voice, no drooling, no stridor. Mouth normal, Mucous membranes moist; Neck: Supple, Full range of motion, No lymphadenopathy; Cardiovascular: Regular rate and rhythm, No murmur, rub, or gallop; Respiratory: Breath sounds clear & equal bilaterally, No rales, rhonchi, wheezes.  Speaking full sentences with ease, Normal respiratory effort/excursion; Chest: Nontender, Movement normal; Abdomen: Soft, Nontender, Nondistended, Normal bowel sounds; Genitourinary: No CVA tenderness; Spine:  No midline CS, TS, LS tenderness. Spine:  No midline CS, TS, LS tenderness.  +TTP right lumbar paraspinal muscles;; Extremities: Pulses normal, No tenderness, No edema, No calf edema or asymmetry.; Neuro: AA&Ox3, Major CN grossly intact.  Speech clear. Normal coordination. No gross focal motor or sensory deficits in extremities.; Skin: Color normal, Warm, Dry.   ED Course  Procedures    MDM  MDM Reviewed: nursing note, previous chart and vitals Reviewed previous: labs Interpretation: labs   Results for orders placed during the hospital encounter of 07/09/12  URINALYSIS, ROUTINE W REFLEX MICROSCOPIC      Component Value Range   Color, Urine YELLOW  YELLOW   APPearance HAZY (*) CLEAR   Specific Gravity, Urine >1.030 (*) 1.005 - 1.030   pH 5.5  5.0 - 8.0   Glucose, UA NEGATIVE  NEGATIVE mg/dL   Hgb urine dipstick NEGATIVE  NEGATIVE   Bilirubin Urine NEGATIVE  NEGATIVE   Ketones, ur NEGATIVE  NEGATIVE mg/dL   Protein, ur NEGATIVE  NEGATIVE mg/dL   Urobilinogen, UA 0.2  0.0 - 1.0 mg/dL   Nitrite NEGATIVE  NEGATIVE   Leukocytes, UA TRACE (*) NEGATIVE  PREGNANCY, URINE      Component Value Range   Preg Test, Ur NEGATIVE  NEGATIVE  URINE MICROSCOPIC-ADD ON      Component Value Range   Squamous Epithelial / LPF FEW (*) RARE   WBC, UA 0-2  <3 WBC/hpf   RBC / HPF 0-2  <3 RBC/hpf   Urine-Other  TRICHOMONAS PRESENT    RAPID STREP SCREEN      Component Value Range   Streptococcus, Group A Screen (Direct) NEGATIVE  NEGATIVE     1420:  Pt's Udip improved today.  UC is pending.  Labs from previous ED visit normal.  Will tx for trich in urine today; pt refuses pelvic exam.  Neg rapid strep, will tx symptomatically for URI.  No fevers while in ED, VS remain stable.  Dx testing d/w pt.  Questions answered.  Verb understanding, agreeable to d/c home with outpt f/u.            Laray Anger, DO 07/12/12 1408

## 2012-07-10 LAB — URINE CULTURE: Colony Count: 4000

## 2012-09-26 ENCOUNTER — Telehealth: Payer: Self-pay

## 2012-09-26 NOTE — Telephone Encounter (Signed)
error 

## 2013-03-09 ENCOUNTER — Ambulatory Visit: Payer: Medicaid Other | Admitting: Neurology

## 2014-09-01 ENCOUNTER — Emergency Department (HOSPITAL_COMMUNITY)
Admission: EM | Admit: 2014-09-01 | Discharge: 2014-09-02 | Disposition: A | Discharge status: Home / Self Care | Payer: Self-pay | Attending: Emergency Medicine | Admitting: Emergency Medicine

## 2014-09-01 DIAGNOSIS — J159 Unspecified bacterial pneumonia: Secondary | ICD-10-CM | POA: Insufficient documentation

## 2014-09-01 DIAGNOSIS — Z8659 Personal history of other mental and behavioral disorders: Secondary | ICD-10-CM | POA: Insufficient documentation

## 2014-09-01 DIAGNOSIS — J189 Pneumonia, unspecified organism: Secondary | ICD-10-CM

## 2014-09-01 DIAGNOSIS — Z8669 Personal history of other diseases of the nervous system and sense organs: Secondary | ICD-10-CM | POA: Insufficient documentation

## 2014-09-01 DIAGNOSIS — Z720 Tobacco use: Secondary | ICD-10-CM | POA: Insufficient documentation

## 2014-09-01 DIAGNOSIS — M5441 Lumbago with sciatica, right side: Secondary | ICD-10-CM | POA: Insufficient documentation

## 2014-09-01 NOTE — ED Notes (Signed)
Pt reporting pain in lower back, radiating into hips.  Denies any abdominal pain, nausea vomiting or urinary symptoms.  Denies known injury, but states she recently had a URI and was coughing quite a bit.

## 2014-09-01 NOTE — ED Provider Notes (Signed)
CSN: [id number]     Arrival date & time 09/01/14  2343 History   This chart was scribed for Derwood KaplanAnkit Julieana Eshleman, MD by Modena JanskyAlbert Thayil, ED Scribe. This patient was seen in room APA04/[facility]04 and the patient's care was started at 12:18 AM.  Chief Complaint  Patient presents with  . Back Pain   The history is provided by the patient. No language interpreter was used.   HPI Comments: Alexandra Ball is a 33 y.o. female with no hx of chronic medical problems who presents to the Emergency Department complaining of constant moderate bilateral back pain that started 3 days ago. She reports that the pain radiates to her bilateral knees She describes the pain as a throbbing sensation. She states that the pain is exacerbated by ambulation. She reports a hx of cholecystectomy. She denies any associated dysuria, hematuria, or numbness or tingling.   Pt also complains of a moderate intermittent cough that started 2 weeks ago. She reports that the cough is productive of green sputum. She states that she has associated wheezing. She reports that she is currently a smoker and smokes one pack per day. She denies any fever.   Past Medical History  Diagnosis Date  . Seizures     mild one 6months ago, off meds for pregnancy. Was on lamictil and dilatin  . Substance abuse     Multiple substances  . Carpal tunnel syndrome   . Bipolar disorder    Past Surgical History  Procedure Laterality Date  . Cholecystectomy open  2006    during second pregnancy  . Tubal ligation  07/01/2011    Procedure: POST PARTUM TUBAL LIGATION;  Surgeon: Scheryl DarterJames Arnold, MD;  Location: [facility];  Service: Gynecology;  Laterality: Bilateral;  With Filshie Clips   . Ovarian cyst removal     History reviewed. No pertinent family history. History  Substance Use Topics  . Smoking status: Current Every Day Smoker -- 1.00 packs/day for 10 years    Types: Cigarettes  . Smokeless tobacco: Never Used  . Alcohol Use: Yes     Comment: previos hx of  drug abuse admitted to behavioral health in 2010; states occasionally on 03/06/12   OB History   Grav Para Term Preterm Abortions TAB SAB Ect Mult Living   6 3 3  0 2 0 2 0 0 3     Review of Systems  Constitutional: Negative for fever.  Respiratory: Positive for cough and wheezing.   Genitourinary: Negative for dysuria and hematuria.  Musculoskeletal: Positive for back pain.  Neurological: Negative for numbness.  All other systems reviewed and are negative.   Allergies  Ultram  Home Medications   Prior to Admission medications   Medication Sig Start Date End Date Taking? Authorizing Provider  azithromycin (ZITHROMAX) 250 MG tablet Take 1 tablet (250 mg total) by mouth daily. 09/02/14   Derwood KaplanAnkit Lenoard Helbert, MD  cefUROXime (CEFTIN) 500 MG tablet Take 1 tablet (500 mg total) by mouth 2 (two) times daily with a meal. 09/02/14   Derwood KaplanAnkit Luciana Cammarata, MD  HYDROcodone-acetaminophen (NORCO/VICODIN) 5-325 MG per tablet Take 1 tablet by mouth every 6 (six) hours as needed. 09/02/14   Derwood KaplanAnkit Kaye Mitro, MD  ibuprofen (ADVIL,MOTRIN) 200 MG tablet Take 400 mg by mouth every 6 (six) hours as needed. Fever    Historical Provider, MD  ibuprofen (ADVIL,MOTRIN) 600 MG tablet Take 1 tablet (600 mg total) by mouth every 6 (six) hours as needed. 09/02/14   Derwood KaplanAnkit Derward Marple, MD  methocarbamol (ROBAXIN) 500  MG tablet Take 1 tablet (500 mg total) by mouth 2 (two) times daily. 09/02/14   Derwood KaplanAnkit Tahsin Benyo, MD  predniSONE (DELTASONE) 50 MG tablet Take 1 tablet (50 mg total) by mouth daily. 09/02/14   Derwood KaplanAnkit Marchele Decock, MD  promethazine (PHENERGAN) 25 MG tablet Take 1 tablet (25 mg total) by mouth every 6 (six) hours as needed for nausea. 07/05/12 07/12/12  Benny LennertJoseph L Zammit, MD   BP 126/90  Pulse 107  Temp(Src) 98.1 F (36.7 C) (Oral)  Resp 18  Ht 5\' 2"  (1.575 m)  Wt 140 lb (63.504 kg)  BMI 25.60 kg/m2  SpO2 97%  LMP 08/30/2014 Physical Exam  Nursing note and vitals reviewed. Constitutional: She appears well-developed and  well-nourished.  Cardiovascular: Normal rate and regular rhythm.   Pulmonary/Chest: Effort normal. No respiratory distress.  Diffuse bronchus sounds worse at the bases.   Musculoskeletal: She exhibits tenderness.  L5 extending to the sacrum TTP  Neurological:  Intact and +2 patellar reflexes.  Neuro lower extremity exam able to discriminate between sharp and dull sensations. Strength 4/5. Negative passive straight leg raise.     ED Course  Procedures (including critical care time) DIAGNOSTIC STUDIES: Oxygen Saturation is 97% on RA, normal by my interpretation.    COORDINATION OF CARE: 12:22 AM- Pt advised of plan for treatment which includes medication and radiology and pt agrees.  Labs Review Labs Reviewed - No data to display  Imaging Review Dg Chest 2 View  09/02/2014   CLINICAL DATA:  Productive cough x2 weeks.  EXAM: CHEST  2 VIEW  COMPARISON:  None.  FINDINGS: Mediastinum and hilar structures normal. Infiltrates are noted right middle lobe and lingula. Small left pleural effusion. No pneumothorax. Heart size normal. No acute bony abnormality.  IMPRESSION: Right middle lobe and lingular infiltrates consistent with bilateral pneumonia .   Electronically Signed   By: Maisie Fushomas  Register   On: 09/02/2014 01:44     EKG Interpretation None      MDM   Final diagnoses:  CAP (community acquired pneumonia)  Low back pain with right-sided sciatica, unspecified back pain laterality    I personally performed the services described in this documentation, which was scribed in my presence. The recorded information has been reviewed and is accurate.  PT comes in with cc of back pain and has poor lung exam. Pt is a smoker. No dx of COPD.  DDx includes: - DJD of the back - Spondylitises/ spondylosis - Sciatica - Spinal cord compression - Conus medullaris - Epidural hematoma - Epidural abscess - Lytic/pathologic fracture - Myelitis - Musculoskeletal pain - COPD exacerbation -  CAP  Based on exam - no redflags for severe back pain cause. Hx also benign, as there is no associated numbness, weakness, urinary incontinence, urinary retention, bowel incontinence, saddle anesthesia.  Pt has multifocal PNA. However, she is afebrile and has no hypoxia. I have not ordered labs, but i dont think the labs would change the plan - as her CURB65 likely would be low. Return precautions have been verbalized. The back pain - is worse with cough, and could be from the PNA as well.  Finally, i will give her inhaler and steroid rx.  1st dose of antibiotics here. Pain meds given as well.  Smoking cessation instruction/counseling given:  counseled patient on the dangers of tobacco use, advised patient to stop smoking, and reviewed strategies to maximize success. Talk for 3 minutes       Derwood KaplanAnkit Caryle Helgeson, MD 09/02/14 0301

## 2014-09-02 ENCOUNTER — Emergency Department (HOSPITAL_COMMUNITY): Payer: Medicaid Other

## 2014-09-02 ENCOUNTER — Encounter (HOSPITAL_COMMUNITY): Payer: Self-pay | Admitting: Emergency Medicine

## 2014-09-02 MED ORDER — PREDNISONE 50 MG PO TABS: 50.0000 mg | ORAL_TABLET | Freq: Every day | ORAL | Status: DC

## 2014-09-02 MED ORDER — CEFUROXIME AXETIL 500 MG PO TABS: 500.0000 mg | ORAL_TABLET | Freq: Two times a day (BID) | ORAL | Status: DC

## 2014-09-02 MED ORDER — IPRATROPIUM-ALBUTEROL 0.5-2.5 (3) MG/3ML IN SOLN: 3.0000 mL | Freq: Once | RESPIRATORY_TRACT | Status: DC

## 2014-09-02 MED ORDER — ALBUTEROL SULFATE HFA 108 (90 BASE) MCG/ACT IN AERS
2.0000 | INHALATION_SPRAY | Freq: Once | RESPIRATORY_TRACT | Status: AC
  Administered 2014-09-02: 2 via RESPIRATORY_TRACT
  Filled 2014-09-02: qty 6.7

## 2014-09-02 MED ORDER — ALBUTEROL SULFATE (2.5 MG/3ML) 0.083% IN NEBU
5.0000 mg | INHALATION_SOLUTION | Freq: Once | RESPIRATORY_TRACT | Status: AC
  Administered 2014-09-02: 5 mg via RESPIRATORY_TRACT
  Filled 2014-09-02: qty 6

## 2014-09-02 MED ORDER — DIAZEPAM 2 MG PO TABS
2.0000 mg | ORAL_TABLET | Freq: Once | ORAL | Status: AC
  Administered 2014-09-02: 2 mg via ORAL
  Filled 2014-09-02: qty 1

## 2014-09-02 MED ORDER — AZITHROMYCIN 250 MG PO TABS: 250.0000 mg | ORAL_TABLET | Freq: Every day | ORAL | Status: DC

## 2014-09-02 MED ORDER — IBUPROFEN 400 MG PO TABS
600.0000 mg | ORAL_TABLET | Freq: Once | ORAL | Status: AC
  Administered 2014-09-02: 600 mg via ORAL
  Filled 2014-09-02: qty 2

## 2014-09-02 MED ORDER — IBUPROFEN 600 MG PO TABS: 600.0000 mg | ORAL_TABLET | Freq: Four times a day (QID) | ORAL | Status: DC | PRN

## 2014-09-02 MED ORDER — METHOCARBAMOL 500 MG PO TABS: 500.0000 mg | ORAL_TABLET | Freq: Two times a day (BID) | ORAL | Status: DC

## 2014-09-02 MED ORDER — PREDNISONE 50 MG PO TABS
60.0000 mg | ORAL_TABLET | Freq: Once | ORAL | Status: AC
  Administered 2014-09-02: 60 mg via ORAL
  Filled 2014-09-02 (×2): qty 1

## 2014-09-02 MED ORDER — OXYCODONE-ACETAMINOPHEN 5-325 MG PO TABS
2.0000 | ORAL_TABLET | Freq: Once | ORAL | Status: AC
  Administered 2014-09-02: 2 via ORAL
  Filled 2014-09-02: qty 2

## 2014-09-02 MED ORDER — ALBUTEROL SULFATE (2.5 MG/3ML) 0.083% IN NEBU: 5.0000 mg | INHALATION_SOLUTION | RESPIRATORY_TRACT | Status: DC

## 2014-09-02 MED ORDER — HYDROCODONE-ACETAMINOPHEN 5-325 MG PO TABS: 1.0000 | ORAL_TABLET | Freq: Four times a day (QID) | ORAL | Status: DC | PRN

## 2014-09-02 MED ORDER — AZITHROMYCIN 250 MG PO TABS
500.0000 mg | ORAL_TABLET | Freq: Once | ORAL | Status: AC
  Administered 2014-09-02: 500 mg via ORAL
  Filled 2014-09-02: qty 2

## 2014-09-02 MED ORDER — IPRATROPIUM BROMIDE 0.02 % IN SOLN
0.5000 mg | Freq: Once | RESPIRATORY_TRACT | Status: AC
  Administered 2014-09-02: 0.5 mg via RESPIRATORY_TRACT
  Filled 2014-09-02: qty 2.5

## 2014-09-02 MED ORDER — CEFUROXIME AXETIL 250 MG PO TABS
500.0000 mg | ORAL_TABLET | Freq: Two times a day (BID) | ORAL | Status: DC
  Administered 2014-09-02: 500 mg via ORAL
  Filled 2014-09-02: qty 2

## 2014-09-02 NOTE — ED Notes (Signed)
Pt. Ambulated with pulse ox, O2 maintained above 95%

## 2014-09-02 NOTE — Discharge Instructions (Signed)
RESOURCE GUIDE  Chronic Pain Problems: Contact [facility]  [phone] Patients need to be referred by their primary care doctor.  Insufficient Money for Medicine: Contact [employer]:  call "211."   No Primary Care Doctor: - Call [facility] Connect  [phone] - can help you locate a primary care doctor that  accepts your insurance, provides certain services, etc. - Physician Referral Service- [phone]  Agencies that provide inexpensive medical care: - [facility]  [facility]  [phone] - [facility]  [phone] - [facility]  [phone] - Planned Parenthood  [facility]  [phone]  [place] Providers: - [facility]- [address]  [phone], Mon-Fri 9am-7pm, Sat 9am-1pm - [facility] [place], [address] [place]  [place], [address] [place]  [place]- [address]  [place], [address], [phone]  Only accepts [place] patients after they have their name  applied to their card  Self Pay (no insurance) in [place]: - Sickle Cell Patients: Dr Kevan Ny, [facility]  [place], [facility]- [place]  [facility]- [id number] [place], [facility]- see information above (Speak to [employer] if you do not have insurance)       -  [facility]- [place],  [place], [place]  Dr Vista Lawman-  [address], [place], [place], [place]       -  Urgent Medical and [facility] - [address] [phone]       -  [facility]- [address], [place],  also [address], [phone]       -    [facility]- [place], [place], 1st & 3rd Saturday        every month, 10am-1pm  [facility] [place], [place] [address] [phone]  [facility] [place]. [place], [place] [address] [phone]  1) Find a Doctor and Pay Out of Pocket Although you won't have to find out who is covered by your insurance plan, it is a good idea to ask around and get recommendations. You will then need to call the office and see if the doctor you have chosen will accept you as a new patient and what types of options they offer for patients who are self-pay. Some doctors offer discounts or will set up payment plans for their patients who do not have insurance, but you will need to ask so you aren't surprised when you get to your appointment.  2) Contact Your Local Health Department Not all health departments have doctors that can see patients for sick visits, but many do, so it is worth a call to see if yours does. If you don't know where your local health department is, you can check in your phone book. The CDC also has a tool  to help you locate your state's health department, and many state websites also have listings of all of their local health departments.  3) Find a Walk-in Clinic If your illness is not likely to be very severe or complicated, you may want to try a walk in clinic. These are popping up all over the country in pharmacies, drugstores, and shopping centers. They're usually staffed by nurse practitioners or physician assistants that have been trained to treat common illnesses and complaints. They're usually fairly quick and inexpensive. However, if you have serious medical issues or chronic medical problems, these are probably not your best option  STD Testing - [facility] Department of [facility], STD Clinic, [address], [place],  phone [phone] or [phone].  Monday - Friday, call for an appointment. [facility] Department of [employer], STD Clinic, [place] [address], [place], phone [phone] or [phone].  Monday - Friday, call for an appointment.  Abuse/Neglect: [facility] Child Abuse Hotline 786-123-8076 [facility] Child Abuse Hotline (680)795-2308 (After Hours)  Emergency Shelter:  [facility] [phone]  Maternity Homes: - [facility] [place] [facility] [phone] - [facility] [phone]  MRSA Hotline #:   775-439-9089  Dental Assistance If unable to pay or uninsured, contact:  [facility]. to become qualified for the adult dental clinic.  Patients with Medicaid: [facility] [phone] [address], [phone] [address] [phone]  If unable to pay, or uninsured, contact [facility] [phone] in [place], [phone] in [facility]) to become qualified for the adult dental clinic  [facility] [address] [place], [place] [address] [phone] [email or web address]  Other Proofreader Services: - [facility]- [address] [place], [place], [place], [address], [phone], [phone], 2nd and 4th Thursday of the month at 6:30am.  10 clients each day by appointment, can sometimes see walk-in patients if someone does not show for an appointment. [facility]- [address] [place], [place], [address], [phone] - [facility]- [address] [place], [place], [address], [phone] [facility] Health Department- [phone] [facility] Health Department- [phone] [facility] [phone]          Back Pain, Adult Back pain is very common. The pain often gets better over time. The cause of back pain is usually not dangerous. Most people can learn to manage their back pain on  their own.  HOME CARE   Stay active. Start with short walks on flat ground if you can. Try to walk farther each day.  Do not sit, drive, or stand in one place for more than 30 minutes. Do not stay in bed.  Do not avoid exercise or work. Activity can help your back heal faster.  Be careful when you bend or lift an object. Bend at your knees, keep the object close to you, and do not twist.  Sleep on a firm mattress. Lie on your side, and bend your knees. If you lie on your back, put a pillow under your knees.  Only take medicines as told by your doctor.  Put ice on the injured area.  Put ice in a plastic bag.  Place a towel between your skin and the bag.  Leave the ice on for 15-20 minutes, 03-04 times a day for the first 2 to 3 days. After that,  you can switch between ice and heat packs.  Ask your doctor about back exercises or massage.  Avoid feeling anxious or stressed. Find good ways to deal with stress, such as exercise. GET HELP RIGHT AWAY IF:   Your pain does not go away with rest or medicine.  Your pain does not go away in 1 week.  You have new problems.  You do not feel well.  The pain spreads into your legs.  You cannot control when you poop (bowel movement) or pee (urinate).  Your arms or legs feel weak or lose feeling (numbness).  You feel sick to your stomach (nauseous) or throw up (vomit).  You have belly (abdominal) pain.  You feel like you may pass out (faint). MAKE SURE YOU:   Understand these instructions.  Will watch your condition.  Will get help right away if you are not doing well or get worse. Document Released: 04/27/2008 Document Revised: 02/01/2012 Document Reviewed: 03/13/2014 [facility] Patient Information 2015 [place]. This information is not intended to replace advice given to you by your health care provider. Make sure you discuss any questions you have with your health care provider.  Pneumonia Pneumonia is an infection of  the lungs.  CAUSES Pneumonia may be caused by bacteria or a virus. Usually, these infections are caused by breathing infectious particles into the lungs (respiratory tract). SIGNS AND SYMPTOMS   Cough.  Fever.  Chest pain.  Increased rate of breathing.  Wheezing.  Mucus production. DIAGNOSIS  If you have the common symptoms of pneumonia, your health care provider will typically confirm the diagnosis with a chest X-ray. The X-ray will show an abnormality in the lung (pulmonary infiltrate) if you have pneumonia. Other tests of your blood, urine, or sputum may be done to find the specific cause of your pneumonia. Your health care provider may also do tests (blood gases or pulse oximetry) to see how well your lungs are working. TREATMENT  Some forms of pneumonia may be spread to other people when you cough or sneeze. You may be asked to wear a mask before and during your exam. Pneumonia that is caused by bacteria is treated with antibiotic medicine. Pneumonia that is caused by the influenza virus may be treated with an antiviral medicine. Most other viral infections must run their course. These infections will not respond to antibiotics.  HOME CARE INSTRUCTIONS   Cough suppressants may be used if you are losing too much rest. However, coughing protects you by clearing your lungs. You should avoid using cough suppressants if you can.  Your health care provider may have prescribed medicine if he or she thinks your pneumonia is caused by bacteria or influenza. Finish your medicine even if you start to feel better.  Your health care provider may also prescribe an expectorant. This loosens the mucus to be coughed up.  Take medicines only as directed by your health care provider.  Do not smoke. Smoking is a common cause of bronchitis and can contribute to pneumonia. If you are a smoker and continue to smoke, your cough may last several weeks after your pneumonia has cleared.  A cold steam  vaporizer or humidifier in your room or home may help loosen mucus.  Coughing is often worse at night. Sleeping in a semi-upright position in a recliner or using a couple pillows under your head will help with this.  Get rest as you feel it is needed. Your body will usually let you know when you need  to rest. PREVENTION A pneumococcal shot (vaccine) is available to prevent a common bacterial cause of pneumonia. This is usually suggested for:  People over 42 years old.  Patients on chemotherapy.  People with chronic lung problems, such as bronchitis or emphysema.  People with immune system problems. If you are over 65 or have a high risk condition, you may receive the pneumococcal vaccine if you have not received it before. In some countries, a routine influenza vaccine is also recommended. This vaccine can help prevent some cases of pneumonia.You may be offered the influenza vaccine as part of your care. If you smoke, it is time to quit. You may receive instructions on how to stop smoking. Your health care provider can provide medicines and counseling to help you quit. SEEK MEDICAL CARE IF: You have a fever. SEEK IMMEDIATE MEDICAL CARE IF:   Your illness becomes worse. This is especially true if you are elderly or weakened from any other disease.  You cannot control your cough with suppressants and are losing sleep.  You begin coughing up blood.  You develop pain which is getting worse or is uncontrolled with medicines.  Any of the symptoms which initially brought you in for treatment are getting worse rather than better.  You develop shortness of breath or chest pain. MAKE SURE YOU:   Understand these instructions.  Will watch your condition.  Will get help right away if you are not doing well or get worse. Document Released: 11/09/2005 Document Revised: 03/26/2014 Document Reviewed: 01/29/2011 [facility] Patient Information 2015 [place], [place]. This information is not  intended to replace advice given to you by your health care provider. Make sure you discuss any questions you have with your health care provider. Chronic Obstructive Pulmonary Disease Chronic obstructive pulmonary disease (COPD) is a common lung condition in which airflow from the lungs is limited. COPD is a general term that can be used to describe many different lung problems that limit airflow, including both chronic bronchitis and emphysema. If you have COPD, your lung function will probably never return to normal, but there are measures you can take to improve lung function and make yourself feel better.  CAUSES   Smoking (common).   Exposure to secondhand smoke.   Genetic problems.  Chronic inflammatory lung diseases or recurrent infections. SYMPTOMS   Shortness of breath, especially with physical activity.   Deep, persistent (chronic) cough with a large amount of thick mucus.   Wheezing.   Rapid breaths (tachypnea).   Gray or bluish discoloration (cyanosis) of the skin, especially in fingers, toes, or lips.   Fatigue.   Weight loss.   Frequent infections or episodes when breathing symptoms become much worse (exacerbations).   Chest tightness. DIAGNOSIS  Your health care provider will take a medical history and perform a physical examination to make the initial diagnosis. Additional tests for COPD may include:   Lung (pulmonary) function tests.  Chest X-ray.  CT scan.  Blood tests. TREATMENT  Treatment available to help you feel better when you have COPD includes:   Inhaler and nebulizer medicines. These help manage the symptoms of COPD and make your breathing more comfortable.  Supplemental oxygen. Supplemental oxygen is only helpful if you have a low oxygen level in your blood.   Exercise and physical activity. These are beneficial for nearly all people with COPD. Some people may also benefit from a pulmonary rehabilitation program. HOME CARE  INSTRUCTIONS   Take all medicines (inhaled or pills) as directed by  your health care provider.  Avoid over-the-counter medicines or cough syrups that dry up your airway (such as antihistamines) and slow down the elimination of secretions unless instructed otherwise by your health care provider.   If you are a smoker, the most important thing that you can do is stop smoking. Continuing to smoke will cause further lung damage and breathing trouble. Ask your health care provider for help with quitting smoking. He or she can direct you to community resources or hospitals that provide support.  Avoid exposure to irritants such as smoke, chemicals, and fumes that aggravate your breathing.  Use oxygen therapy and pulmonary rehabilitation if directed by your health care provider. If you require home oxygen therapy, ask your health care provider whether you should purchase a pulse oximeter to measure your oxygen level at home.   Avoid contact with individuals who have a contagious illness.  Avoid extreme temperature and humidity changes.  Eat healthy foods. Eating smaller, more frequent meals and resting before meals may help you maintain your strength.  Stay active, but balance activity with periods of rest. Exercise and physical activity will help you maintain your ability to do things you want to do.  Preventing infection and hospitalization is very important when you have COPD. Make sure to receive all the vaccines your health care provider recommends, especially the pneumococcal and influenza vaccines. Ask your health care provider whether you need a pneumonia vaccine.  Learn and use relaxation techniques to manage stress.  Learn and use controlled breathing techniques as directed by your health care provider. Controlled breathing techniques include:   Pursed lip breathing. Start by breathing in (inhaling) through your nose for 1 second. Then, purse your lips as if you were going to whistle  and breathe out (exhale) through the pursed lips for 2 seconds.   Diaphragmatic breathing. Start by putting one hand on your abdomen just above your waist. Inhale slowly through your nose. The hand on your abdomen should move out. Then purse your lips and exhale slowly. You should be able to feel the hand on your abdomen moving in as you exhale.   Learn and use controlled coughing to clear mucus from your lungs. Controlled coughing is a series of short, progressive coughs. The steps of controlled coughing are:  1. Lean your head slightly forward.  2. Breathe in deeply using diaphragmatic breathing.  3. Try to hold your breath for 3 seconds.  4. Keep your mouth slightly open while coughing twice.  5. Spit any mucus out into a tissue.  6. Rest and repeat the steps once or twice as needed. SEEK MEDICAL CARE IF:   You are coughing up more mucus than usual.   There is a change in the color or thickness of your mucus.   Your breathing is more labored than usual.   Your breathing is faster than usual.  SEEK IMMEDIATE MEDICAL CARE IF:   You have shortness of breath while you are resting.   You have shortness of breath that prevents you from:  Being able to talk.   Performing your usual physical activities.   You have chest pain lasting longer than 5 minutes.   Your skin color is more cyanotic than usual.  You measure low oxygen saturations for longer than 5 minutes with a pulse oximeter. MAKE SURE YOU:   Understand these instructions.  Will watch your condition.  Will get help right away if you are not doing well or get worse. Document Released: 08/19/2005  Document Revised: 03/26/2014 Document Reviewed: 07/06/2013 [facility] Patient Information 2015 [place], [place]. This information is not intended to replace advice given to you by your health care provider. Make sure you discuss any questions you have with your health care provider.

## 2014-09-24 ENCOUNTER — Encounter (HOSPITAL_COMMUNITY): Payer: Self-pay | Admitting: Emergency Medicine

## 2014-11-13 ENCOUNTER — Encounter (HOSPITAL_COMMUNITY): Payer: Self-pay | Admitting: Emergency Medicine

## 2014-11-13 ENCOUNTER — Emergency Department (HOSPITAL_COMMUNITY)
Admission: EM | Admit: 2014-11-13 | Discharge: 2014-11-14 | Discharge status: Against Medical Advice | Payer: Medicaid Other | Attending: Emergency Medicine | Admitting: Emergency Medicine

## 2014-11-13 DIAGNOSIS — Z8669 Personal history of other diseases of the nervous system and sense organs: Secondary | ICD-10-CM | POA: Insufficient documentation

## 2014-11-13 DIAGNOSIS — Z8659 Personal history of other mental and behavioral disorders: Secondary | ICD-10-CM | POA: Insufficient documentation

## 2014-11-13 DIAGNOSIS — Z720 Tobacco use: Secondary | ICD-10-CM | POA: Insufficient documentation

## 2014-11-13 DIAGNOSIS — Z9114 Patient's other noncompliance with medication regimen: Secondary | ICD-10-CM | POA: Insufficient documentation

## 2014-11-13 DIAGNOSIS — R569 Unspecified convulsions: Secondary | ICD-10-CM | POA: Insufficient documentation

## 2014-11-13 MED ORDER — LAMOTRIGINE 150 MG PO TABS
300.0000 mg | ORAL_TABLET | Freq: Once | ORAL | Status: DC
  Filled 2014-11-13: qty 2

## 2014-11-13 MED ORDER — PHENYTOIN SODIUM 50 MG/ML IJ SOLN
1000.0000 mg | Freq: Once | INTRAMUSCULAR | Status: DC
  Filled 2014-11-13: qty 20

## 2014-11-13 NOTE — ED Notes (Signed)
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 Pt reports feeling nauseous but no vomiting.

## 2014-11-13 NOTE — ED Provider Notes (Signed)
CSN: [id number]     Arrival date & time 11/13/14  2028 History  This chart was scribed for Dione Boozeavid Laurelai Lepp, MD by Murriel HopperAlec Bankhead, ED Scribe. This patient was seen in room APA05/[facility]05 and the patient's care was started at 11:30 PM.    Chief Complaint  Patient presents with  . Seizures    The history is provided by the patient and a friend. No language interpreter was used.     HPI Comments: Alexandra Ball is a 33 y.o. female with a Hx of seizures who presents to the Emergency Department complaining of seizures that occurred yesterday and tonight with associated right shoulder pain that the pt rates as 6/10. Her husband that is accompanying her in the ED tonight states that he was present for both seizures. He states that yesterday she had convulsions for 2-3 minutes where she was arching her back and kicking her legs, and was incoherent. He states that tonight she had similar convulsions for 15 minutes and was coherent towards the end of the episode. He states that she was able to talk to him but was unable to stop the convulsions. Pt denies biting her tongue, and loss of control of bladder or bowels. Pt states that she does not take seizure medications because she cannot afford them.   Seizure yesteday and then again tonight Friend saw both seizures---- yesterday; convulsions for 2-3 minutes--- kicking her legs and arching her back--- she is awake but noncoherent Today--- did same thing 15 mins--- was coherent and states she could not make herself shirt No biting of tongue, no loss of control of bowels or bladder 6/10 pain in right shoulder Pt is not on seizure medications-- states she cannot afford them-- taking 2 medications once per day Pt smokes a pack per day   Past Medical History  Diagnosis Date  . Seizures     mild one 6months ago, off meds for pregnancy. Was on lamictil and dilatin  . Substance abuse     Multiple substances  . Carpal tunnel syndrome   . Bipolar disorder     Past Surgical History  Procedure Laterality Date  . Cholecystectomy open  2006    during second pregnancy  . Tubal ligation  07/01/2011    Procedure: POST PARTUM TUBAL LIGATION;  Surgeon: Scheryl DarterJames Arnold, MD;  Location: [facility];  Service: Gynecology;  Laterality: Bilateral;  With Filshie Clips   . Ovarian cyst removal     History reviewed. No pertinent family history. History  Substance Use Topics  . Smoking status: Current Every Day Smoker -- 1.00 packs/day for 10 years    Types: Cigarettes  . Smokeless tobacco: Never Used  . Alcohol Use: Yes     Comment: previos hx of drug abuse admitted to behavioral health in 2010; states occasionally on 03/06/12   OB History    Gravida Para Term Preterm AB TAB SAB Ectopic Multiple Living   6 3 3  0 2 0 2 0 0 3     Review of Systems  Neurological: Positive for seizures.      Allergies  Ultram  Home Medications   Prior to Admission medications   Medication Sig Start Date End Date Taking? Authorizing Provider  ibuprofen (ADVIL,MOTRIN) 200 MG tablet Take 400-600 mg by mouth every 6 (six) hours as needed. Fever   Yes Historical Provider, MD  azithromycin (ZITHROMAX) 250 MG tablet Take 1 tablet (250 mg total) by mouth daily. Patient not taking: Reported on 11/13/2014 09/02/14  Derwood Kaplan, MD  cefUROXime (CEFTIN) 500 MG tablet Take 1 tablet (500 mg total) by mouth 2 (two) times daily with a meal. Patient not taking: Reported on 11/13/2014 09/02/14   Derwood Kaplan, MD  HYDROcodone-acetaminophen (NORCO/VICODIN) 5-325 MG per tablet Take 1 tablet by mouth every 6 (six) hours as needed. Patient not taking: Reported on 11/13/2014 09/02/14   Derwood Kaplan, MD  ibuprofen (ADVIL,MOTRIN) 600 MG tablet Take 1 tablet (600 mg total) by mouth every 6 (six) hours as needed. Patient not taking: Reported on 11/13/2014 09/02/14   Derwood Kaplan, MD  methocarbamol (ROBAXIN) 500 MG tablet Take 1 tablet (500 mg total) by mouth 2 (two) times daily. Patient  not taking: Reported on 11/13/2014 09/02/14   Derwood Kaplan, MD  predniSONE (DELTASONE) 50 MG tablet Take 1 tablet (50 mg total) by mouth daily. Patient not taking: Reported on 11/13/2014 09/02/14   Derwood Kaplan, MD  promethazine (PHENERGAN) 25 MG tablet Take 1 tablet (25 mg total) by mouth every 6 (six) hours as needed for nausea. Patient not taking: Reported on 11/13/2014 07/05/12 07/12/12  Benny Lennert, MD   BP 120/64 mmHg  Pulse 88  Ht 5\' 2"  (1.575 m)  Wt 145 lb (65.772 kg)  BMI 26.51 kg/m2  SpO2 100%  LMP 11/06/2014 Physical Exam  Constitutional: She is oriented to person, place, and time. She appears well-developed and well-nourished.  HENT:  Head: Normocephalic and atraumatic.  Eyes: EOM are normal. Pupils are equal, round, and reactive to light.  Neck: Normal range of motion. Neck supple. No JVD present.  Cardiovascular: Normal rate, regular rhythm and normal heart sounds.   No murmur heard. Pulmonary/Chest: Effort normal and breath sounds normal. She has no wheezes. She has no rales. She exhibits no tenderness.  Abdominal: Bowel sounds are normal. She exhibits no distension and no mass. There is no tenderness.  Musculoskeletal: Normal range of motion. She exhibits no edema.  Lymphadenopathy:    She has no cervical adenopathy.  Neurological: She is alert and oriented to person, place, and time. She has normal reflexes. No cranial nerve deficit. She exhibits normal muscle tone. Coordination normal.  Skin: Skin is warm and dry. No rash noted.  Psychiatric: She has a normal mood and affect. Her behavior is normal. Judgment and thought content normal.    ED Course  Procedures (including critical care time)  DIAGNOSTIC STUDIES: Oxygen Saturation is 100% on RA, normal by my interpretation.    COORDINATION OF CARE: 11:35 PM Discussed treatment plan with pt at bedside and pt agreed to plan.   MDM   Final diagnoses:  Seizures  Noncompliance with medications     Patient with known seizure disorder and noncompliance with medications. She states the problem has caused her medications. It was pointed out to her that she smokes 1 pack of cigarettes a day and should she stop, noticed a considerable amount of medication which could go towards paying for her seizure medications. She was advised that I would give her a dose of her Lamictal and loading dose of phenytoin and would be given prescription for one-month supply of both of those, but she would need to work on getting her prescriptions filled on her own. Orders were placed, but patient left AMA before anything could be done. She had left the department before I was made aware of her leaving.  I personally performed the services described in this documentation, which was scribed in my presence. The recorded information has been reviewed and is  accurate.      Dione Boozeavid Jodie Leiner, MD 11/14/14 [phone]

## 2014-11-14 NOTE — ED Notes (Signed)
Pt declined prescribed orders for treatment and left AMA, without signing.

## 2015-07-11 ENCOUNTER — Emergency Department (HOSPITAL_COMMUNITY): Payer: Medicaid Other

## 2015-07-11 ENCOUNTER — Emergency Department (HOSPITAL_COMMUNITY)
Admission: EM | Admit: 2015-07-11 | Discharge: 2015-07-11 | Disposition: A | Discharge status: Home / Self Care | Payer: Medicaid Other | Attending: Emergency Medicine | Admitting: Emergency Medicine

## 2015-07-11 ENCOUNTER — Encounter (HOSPITAL_COMMUNITY): Payer: Self-pay | Admitting: Emergency Medicine

## 2015-07-11 DIAGNOSIS — R112 Nausea with vomiting, unspecified: Secondary | ICD-10-CM | POA: Insufficient documentation

## 2015-07-11 DIAGNOSIS — Z720 Tobacco use: Secondary | ICD-10-CM | POA: Insufficient documentation

## 2015-07-11 DIAGNOSIS — S060X0A Concussion without loss of consciousness, initial encounter: Secondary | ICD-10-CM

## 2015-07-11 DIAGNOSIS — W01198A Fall on same level from slipping, tripping and stumbling with subsequent striking against other object, initial encounter: Secondary | ICD-10-CM | POA: Insufficient documentation

## 2015-07-11 DIAGNOSIS — M549 Dorsalgia, unspecified: Secondary | ICD-10-CM

## 2015-07-11 DIAGNOSIS — Y9389 Activity, other specified: Secondary | ICD-10-CM | POA: Insufficient documentation

## 2015-07-11 DIAGNOSIS — S24109A Unspecified injury at unspecified level of thoracic spinal cord, initial encounter: Secondary | ICD-10-CM | POA: Insufficient documentation

## 2015-07-11 DIAGNOSIS — G40909 Epilepsy, unspecified, not intractable, without status epilepticus: Secondary | ICD-10-CM | POA: Insufficient documentation

## 2015-07-11 DIAGNOSIS — Z8659 Personal history of other mental and behavioral disorders: Secondary | ICD-10-CM | POA: Insufficient documentation

## 2015-07-11 DIAGNOSIS — Y9289 Other specified places as the place of occurrence of the external cause: Secondary | ICD-10-CM | POA: Insufficient documentation

## 2015-07-11 DIAGNOSIS — Y990 Civilian activity done for income or pay: Secondary | ICD-10-CM | POA: Insufficient documentation

## 2015-07-11 DIAGNOSIS — S0990XA Unspecified injury of head, initial encounter: Secondary | ICD-10-CM

## 2015-07-11 DIAGNOSIS — Z79899 Other long term (current) drug therapy: Secondary | ICD-10-CM | POA: Insufficient documentation

## 2015-07-11 MED ORDER — ONDANSETRON 8 MG PO TBDP
8.0000 mg | ORAL_TABLET | Freq: Once | ORAL | Status: AC
  Administered 2015-07-11: 8 mg via ORAL
  Filled 2015-07-11: qty 1

## 2015-07-11 MED ORDER — OXYCODONE-ACETAMINOPHEN 5-325 MG PO TABS
2.0000 | ORAL_TABLET | Freq: Once | ORAL | Status: AC
  Administered 2015-07-11: 2 via ORAL
  Filled 2015-07-11: qty 2
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
## 2015-07-11 NOTE — ED Notes (Signed)
Pt ambulated to bathroom.  Padded side rails.
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
## 2015-07-22 ENCOUNTER — Encounter (HOSPITAL_COMMUNITY): Payer: Self-pay | Admitting: Cardiology

## 2015-07-22 ENCOUNTER — Emergency Department (HOSPITAL_COMMUNITY)
Admission: EM | Admit: 2015-07-22 | Discharge: 2015-07-22 | Disposition: A | Discharge status: Home / Self Care | Payer: Medicaid Other | Attending: Emergency Medicine | Admitting: Emergency Medicine

## 2015-07-22 DIAGNOSIS — F1410 Cocaine abuse, uncomplicated: Secondary | ICD-10-CM | POA: Insufficient documentation

## 2015-07-22 DIAGNOSIS — R569 Unspecified convulsions: Secondary | ICD-10-CM

## 2015-07-22 DIAGNOSIS — F1310 Sedative, hypnotic or anxiolytic abuse, uncomplicated: Secondary | ICD-10-CM | POA: Insufficient documentation

## 2015-07-22 DIAGNOSIS — Z8659 Personal history of other mental and behavioral disorders: Secondary | ICD-10-CM | POA: Insufficient documentation

## 2015-07-22 DIAGNOSIS — Z79899 Other long term (current) drug therapy: Secondary | ICD-10-CM | POA: Insufficient documentation

## 2015-07-22 DIAGNOSIS — G40909 Epilepsy, unspecified, not intractable, without status epilepticus: Secondary | ICD-10-CM | POA: Insufficient documentation

## 2015-07-22 DIAGNOSIS — F1110 Opioid abuse, uncomplicated: Secondary | ICD-10-CM | POA: Insufficient documentation

## 2015-07-22 DIAGNOSIS — Z720 Tobacco use: Secondary | ICD-10-CM | POA: Insufficient documentation

## 2015-07-22 LAB — BASIC METABOLIC PANEL
ANION GAP: 6 (ref 5–15)
BUN: 9 mg/dL (ref 6–20)
CALCIUM: 9.3 mg/dL (ref 8.9–10.3)
CO2: 27 mmol/L (ref 22–32)
Chloride: 106 mmol/L (ref 101–111)
Creatinine, Ser: 1.04 mg/dL — ABNORMAL HIGH (ref 0.44–1.00)
GFR calc Af Amer: >60 mL/min (ref >60)
GFR calc non Af Amer: >60 mL/min (ref >60)
GLUCOSE: 141 mg/dL — AB (ref 65–99)
Potassium: 4.2 mmol/L (ref 3.5–5.1)
Sodium: 139 mmol/L (ref 135–145)

## 2015-07-22 LAB — URINALYSIS, ROUTINE W REFLEX MICROSCOPIC
BILIRUBIN URINE: NEGATIVE
GLUCOSE, UA: NEGATIVE mg/dL
HGB URINE DIPSTICK: NEGATIVE
Ketones, ur: NEGATIVE mg/dL
Leukocytes, UA: NEGATIVE
Nitrite: NEGATIVE
PROTEIN: NEGATIVE mg/dL
Specific Gravity, Urine: >1.03 — ABNORMAL HIGH (ref 1.005–1.030)
UROBILINOGEN UA: 0.2 mg/dL (ref 0.0–1.0)
pH: 5 (ref 5.0–8.0)

## 2015-07-22 LAB — CBC
HCT: 40.3 % (ref 36.0–46.0)
Hemoglobin: 12.9 g/dL (ref 12.0–15.0)
MCH: 27.6 pg (ref 26.0–34.0)
MCHC: 32 g/dL (ref 30.0–36.0)
MCV: 86.3 fL (ref 78.0–100.0)
Platelets: 331 10*3/uL (ref 150–400)
RBC: 4.67 MIL/uL (ref 3.87–5.11)
RDW: 15.5 % (ref 11.5–15.5)
WBC: 16.8 10*3/uL — ABNORMAL HIGH (ref 4.0–10.5)

## 2015-07-22 LAB — RAPID URINE DRUG SCREEN, HOSP PERFORMED
Amphetamines: NOT DETECTED
Barbiturates: NOT DETECTED
Benzodiazepines: POSITIVE — AB
Cocaine: POSITIVE — AB
Opiates: POSITIVE — AB
Tetrahydrocannabinol: NOT DETECTED

## 2015-07-22 MED ORDER — IBUPROFEN 800 MG PO TABS
800.0000 mg | ORAL_TABLET | Freq: Once | ORAL | Status: AC
  Administered 2015-07-22: 800 mg via ORAL
  Filled 2015-07-22: qty 1

## 2015-07-22 MED ORDER — ACETAMINOPHEN 500 MG PO TABS
1000.0000 mg | ORAL_TABLET | Freq: Once | ORAL | Status: AC
  Administered 2015-07-22: 1000 mg via ORAL
  Filled 2015-07-22: qty 2

## 2015-07-22 MED ORDER — LORAZEPAM 2 MG/ML IJ SOLN
1.0000 mg | Freq: Once | INTRAMUSCULAR | Status: AC
  Administered 2015-07-22: 1 mg via INTRAVENOUS
  Filled 2015-07-22: qty 1
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
## 2015-07-22 NOTE — Discharge Instructions (Signed)
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
## 2015-07-22 NOTE — ED Notes (Signed)
Pt given crackers, peanut butter and juice upon request.

## 2015-07-24 LAB — URINE CULTURE: Culture: 5000

## 2015-11-10 ENCOUNTER — Encounter (HOSPITAL_COMMUNITY): Payer: Self-pay | Admitting: Emergency Medicine

## 2015-11-10 ENCOUNTER — Emergency Department (HOSPITAL_COMMUNITY)
Admission: EM | Admit: 2015-11-10 | Discharge: 2015-11-10 | Disposition: A | Discharge status: Home / Self Care | Payer: Medicaid Other | Attending: Emergency Medicine | Admitting: Emergency Medicine

## 2015-11-10 DIAGNOSIS — Z8669 Personal history of other diseases of the nervous system and sense organs: Secondary | ICD-10-CM | POA: Insufficient documentation

## 2015-11-10 DIAGNOSIS — R51 Headache: Secondary | ICD-10-CM | POA: Insufficient documentation

## 2015-11-10 DIAGNOSIS — Z79899 Other long term (current) drug therapy: Secondary | ICD-10-CM | POA: Insufficient documentation

## 2015-11-10 DIAGNOSIS — F17210 Nicotine dependence, cigarettes, uncomplicated: Secondary | ICD-10-CM | POA: Insufficient documentation

## 2015-11-10 DIAGNOSIS — Z8659 Personal history of other mental and behavioral disorders: Secondary | ICD-10-CM | POA: Insufficient documentation

## 2015-11-10 DIAGNOSIS — R569 Unspecified convulsions: Secondary | ICD-10-CM | POA: Insufficient documentation

## 2015-11-10 DIAGNOSIS — R32 Unspecified urinary incontinence: Secondary | ICD-10-CM | POA: Insufficient documentation

## 2015-11-10 DIAGNOSIS — Z9119 Patient's noncompliance with other medical treatment and regimen: Secondary | ICD-10-CM | POA: Insufficient documentation

## 2015-11-10 HISTORY — DX: Patient's other noncompliance with medication regimen: Z91.14

## 2015-11-10 HISTORY — DX: Patient's other noncompliance with medication regimen for other reason: Z91.148

## 2015-11-10 LAB — CBG MONITORING, ED: GLUCOSE-CAPILLARY: 111 mg/dL — AB (ref 65–99)

## 2015-11-10 MED ORDER — LACOSAMIDE 200 MG PO TABS: 200.0000 mg | ORAL_TABLET | Freq: Every day | ORAL | Status: DC

## 2015-11-10 MED ORDER — SODIUM CHLORIDE 0.9 % IV BOLUS (SEPSIS)
1000.0000 mL | Freq: Once | INTRAVENOUS | Status: AC
  Administered 2015-11-10: 1000 mL via INTRAVENOUS

## 2015-11-10 MED ORDER — LORAZEPAM 2 MG/ML IJ SOLN
1.0000 mg | Freq: Once | INTRAMUSCULAR | Status: AC
  Administered 2015-11-10: 1 mg via INTRAVENOUS
  Filled 2015-11-10: qty 1

## 2015-11-10 MED ORDER — KETOROLAC TROMETHAMINE 30 MG/ML IJ SOLN
30.0000 mg | Freq: Once | INTRAMUSCULAR | Status: AC
  Administered 2015-11-10: 30 mg via INTRAVENOUS
  Filled 2015-11-10: qty 1

## 2015-11-10 MED ORDER — LORAZEPAM 1 MG PO TABS: 1.0000 mg | ORAL_TABLET | Freq: Two times a day (BID) | ORAL | Status: DC

## 2015-11-10 NOTE — ED Notes (Signed)
Pt states understanding of care given and follow up instructions 

## 2015-11-10 NOTE — Discharge Instructions (Signed)
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
## 2015-11-10 NOTE — ED Notes (Addendum)
Disregard Care Handoff and Departure condition entered at 2049 and 2050, entered in error

## 2015-11-10 NOTE — ED Notes (Deleted)
[facility]

## 2015-11-28 NOTE — ED Notes (Signed)
Pt called reporting could not afford seizure prescription that was provided on 12/18 and went to the pharmacy trying to fill the ativan prescription. Pt informed that can not fill one prescription without the other when they are printed on the same form. Pt reported wanted another prescription. Pt informed would need to be re-seen due to time frame from 12/18.

## 2016-05-01 ENCOUNTER — Emergency Department (HOSPITAL_COMMUNITY): Payer: Medicaid Other

## 2016-05-01 ENCOUNTER — Encounter (HOSPITAL_COMMUNITY): Payer: Self-pay | Admitting: Cardiology

## 2016-05-01 ENCOUNTER — Emergency Department (HOSPITAL_COMMUNITY)
Admission: EM | Admit: 2016-05-01 | Discharge: 2016-05-01 | Disposition: A | Discharge status: Home / Self Care | Payer: Medicaid Other | Attending: Emergency Medicine | Admitting: Emergency Medicine

## 2016-05-01 DIAGNOSIS — F17210 Nicotine dependence, cigarettes, uncomplicated: Secondary | ICD-10-CM | POA: Diagnosis not present

## 2016-05-01 DIAGNOSIS — F319 Bipolar disorder, unspecified: Secondary | ICD-10-CM | POA: Insufficient documentation

## 2016-05-01 DIAGNOSIS — R39198 Other difficulties with micturition: Secondary | ICD-10-CM | POA: Insufficient documentation

## 2016-05-01 DIAGNOSIS — R109 Unspecified abdominal pain: Secondary | ICD-10-CM | POA: Diagnosis not present

## 2016-05-01 DIAGNOSIS — R3915 Urgency of urination: Secondary | ICD-10-CM | POA: Diagnosis not present

## 2016-05-01 LAB — CBC WITH DIFFERENTIAL/PLATELET
Basophils Absolute: 0 10*3/uL (ref 0.0–0.1)
Basophils Relative: 1 %
EOS PCT: 3 %
Eosinophils Absolute: 0.1 10*3/uL (ref 0.0–0.7)
HCT: 36.2 % (ref 36.0–46.0)
Hemoglobin: 11.8 g/dL — ABNORMAL LOW (ref 12.0–15.0)
LYMPHS ABS: 2.2 10*3/uL (ref 0.7–4.0)
LYMPHS PCT: 51 %
MCH: 28.6 pg (ref 26.0–34.0)
MCHC: 32.6 g/dL (ref 30.0–36.0)
MCV: 87.7 fL (ref 78.0–100.0)
MONO ABS: 0.2 10*3/uL (ref 0.1–1.0)
MONOS PCT: 5 %
Neutro Abs: 1.7 10*3/uL (ref 1.7–7.7)
Neutrophils Relative %: 40 %
PLATELETS: 249 10*3/uL (ref 150–400)
RBC: 4.13 MIL/uL (ref 3.87–5.11)
RDW: 14.7 % (ref 11.5–15.5)
WBC: 4.2 10*3/uL (ref 4.0–10.5)

## 2016-05-01 LAB — URINALYSIS, ROUTINE W REFLEX MICROSCOPIC
GLUCOSE, UA: NEGATIVE mg/dL
Ketones, ur: NEGATIVE mg/dL
Leukocytes, UA: NEGATIVE
Nitrite: NEGATIVE
PH: 6 (ref 5.0–8.0)
Protein, ur: NEGATIVE mg/dL
SPECIFIC GRAVITY, URINE: 1.025 (ref 1.005–1.030)

## 2016-05-01 LAB — BASIC METABOLIC PANEL
ANION GAP: 5 (ref 5–15)
BUN: 9 mg/dL (ref 6–20)
CHLORIDE: 107 mmol/L (ref 101–111)
CO2: 25 mmol/L (ref 22–32)
CREATININE: 0.78 mg/dL (ref 0.44–1.00)
Calcium: 8.6 mg/dL — ABNORMAL LOW (ref 8.9–10.3)
GFR calc Af Amer: >60 mL/min (ref >60)
GFR calc non Af Amer: >60 mL/min (ref >60)
Glucose, Bld: 114 mg/dL — ABNORMAL HIGH (ref 65–99)
POTASSIUM: 3.7 mmol/L (ref 3.5–5.1)
Sodium: 137 mmol/L (ref 135–145)

## 2016-05-01 LAB — URINE MICROSCOPIC-ADD ON
SQUAMOUS EPITHELIAL / LPF: NONE SEEN
WBC UA: NONE SEEN WBC/hpf (ref 0–5)

## 2016-05-01 MED ORDER — MORPHINE SULFATE (PF) 4 MG/ML IV SOLN
6.0000 mg | Freq: Once | INTRAVENOUS | Status: DC
  Filled 2016-05-01: qty 2

## 2016-05-01 MED ORDER — MORPHINE SULFATE (PF) 4 MG/ML IV SOLN
6.0000 mg | Freq: Once | INTRAVENOUS | Status: DC
  Administered 2016-05-01: 6 mg via INTRAVENOUS
  Filled 2016-05-01: qty 2

## 2016-05-01 MED ORDER — SODIUM CHLORIDE 0.9 % IV BOLUS (SEPSIS)
2000.0000 mL | Freq: Once | INTRAVENOUS | Status: AC
  Administered 2016-05-01: 2000 mL via INTRAVENOUS

## 2016-05-01 MED ORDER — KETOROLAC TROMETHAMINE 30 MG/ML IJ SOLN
30.0000 mg | Freq: Once | INTRAMUSCULAR | Status: AC
  Administered 2016-05-01: 30 mg via INTRAVENOUS
  Filled 2016-05-01: qty 1

## 2016-05-01 NOTE — ED Provider Notes (Signed)
CSN: [id number]     Arrival date & time 05/01/16  1120 History   By signing my name below, I, Tanda Rockers, attest that this documentation has been prepared under the direction and in the presence of Marily Memos, MD. Electronically Signed: Tanda Rockers, ED Scribe. 05/01/2016. 11:50 AM.    Chief Complaint  Patient presents with  . Flank Pain   The history is provided by the patient. No language interpreter was used.   HPI Comments: Alexandra Ball is a 35 y.o. female who presents to the Emergency Department complaining of sudden onset, worsening, constant left flank pain radiating to LLQ onset 2 days ago. She reports no recent trauma to area. She notes associated difficulty urinating and  urgency. Pt has been eating and drinking normally She states her symptoms feel similar to prior kidney stones last year. Kidney stones did not require surgical intervention. No EtOH or elicit drug usage recently. Pt did drink caffeine this morning. Pt denies nausea, vomiting, fever, vaginal symptoms, chest pain, shortness of breath, cough, or rash.   Past Medical History  Diagnosis Date  . Substance abuse     Multiple substances  . Carpal tunnel syndrome   . Bipolar disorder (HCC)   . Seizures (HCC)     mild one 6months ago, off meds for pregnancy. Was on lamictil and dilatin  . Noncompliance with medication regimen    Past Surgical History  Procedure Laterality Date  . Cholecystectomy open  2006    during second pregnancy  . Tubal ligation  07/01/2011    Procedure: POST PARTUM TUBAL LIGATION;  Surgeon: Scheryl Darter, MD;  Location: [facility];  Service: Gynecology;  Laterality: Bilateral;  With Filshie Clips   . Ovarian cyst removal     History reviewed. No pertinent family history. Social History  Substance Use Topics  . Smoking status: Current Every Day Smoker -- 1.00 packs/day for 10 years    Types: Cigarettes  . Smokeless tobacco: Never Used  . Alcohol Use: No     Comment: previos hx of drug  abuse admitted to behavioral health in 2010; states occasionally on 03/06/12   OB History    Gravida Para Term Preterm AB TAB SAB Ectopic Multiple Living   6 3 3  0 2 0 2 0 0 3     Review of Systems  Constitutional: Negative for fever.  Respiratory: Negative for cough and shortness of breath.   Cardiovascular: Negative for chest pain.  Gastrointestinal: Negative for nausea and vomiting.  Genitourinary: Positive for urgency, flank pain and difficulty urinating. Negative for vaginal bleeding, vaginal discharge and vaginal pain.  Skin: Negative for rash.  All other systems reviewed and are negative.  Allergies  Ultram  Home Medications   Prior to Admission medications   Medication Sig Start Date End Date Taking? Authorizing Provider  ibuprofen (ADVIL,MOTRIN) 200 MG tablet Take 400-600 mg by mouth every 6 (six) hours as needed for mild pain. Fever    Historical Provider, MD  lacosamide (VIMPAT) 200 MG TABS tablet Take 1 tablet (200 mg total) by mouth at bedtime. 11/10/15   Zadie Rhine, MD  LORazepam (ATIVAN) 1 MG tablet Take 1 tablet (1 mg total) by mouth 2 (two) times daily. 11/10/15   Zadie Rhine, MD   BP 141/94 mmHg  Pulse 128  Temp(Src) 98.5 F (36.9 C) (Oral)  Resp 20  Ht 5\' 2"  (1.575 m)  Wt 145 lb (65.772 kg)  BMI 26.51 kg/m2  SpO2 100%  LMP 04/24/2016  Physical Exam  Constitutional: She is oriented to person, place, and time. She appears well-developed and well-nourished. No distress.  HENT:  Head: Normocephalic and atraumatic.  Eyes: Conjunctivae and EOM are normal.  Neck: Neck supple. No tracheal deviation present.  Cardiovascular: Regular rhythm.   tachycardic  Pulmonary/Chest: Effort normal and breath sounds normal. No respiratory distress. She has no wheezes. She has no rales.  Abdominal: Soft. There is no tenderness.  Left CVA tenderness Abdomen begin, no rashes on abdomen  Musculoskeletal: Normal range of motion.  No midline pain  Neurological: She is  alert and oriented to person, place, and time.  Skin: Skin is warm and dry. No rash noted.  Psychiatric: She has a normal mood and affect. Her behavior is normal.  Nursing note and vitals reviewed.   ED Course  Procedures  DIAGNOSTIC STUDIES: Oxygen Saturation is 100% on RA, normal by my interpretation.    COORDINATION OF CARE: 11:44 AM-Discussed treatment plan which includes urinalysis, CT renal stone study, CBC, BMP with pt at bedside and pt agreed to plan.    Labs Review Labs Reviewed  URINALYSIS, ROUTINE W REFLEX MICROSCOPIC (NOT AT [facility]) - Abnormal; Notable for the following:    Hgb urine dipstick LARGE (*)    Bilirubin Urine SMALL (*)    All other components within normal limits  CBC WITH DIFFERENTIAL/PLATELET - Abnormal; Notable for the following:    Hemoglobin 11.8 (*)    All other components within normal limits  BASIC METABOLIC PANEL - Abnormal; Notable for the following:    Glucose, Bld 114 (*)    Calcium 8.6 (*)    All other components within normal limits  URINE MICROSCOPIC-ADD ON - Abnormal; Notable for the following:    Bacteria, UA FEW (*)    All other components within normal limits  URINE CULTURE    Imaging Review Ct Renal Stone Study  05/01/2016  CLINICAL DATA:  35 year old female with a history of left flank pain for 2 days and hematuria EXAM: CT ABDOMEN AND PELVIS WITHOUT CONTRAST TECHNIQUE: Multidetector CT imaging of the abdomen and pelvis was performed following the standard protocol without IV contrast. COMPARISON:  02/28/2016 FINDINGS: Lower chest: Unremarkable appearance of the soft tissues of the chest wall. Heart size within normal limits.  No pericardial fluid/thickening. No lower mediastinal adenopathy. Unremarkable appearance of the distal esophagus. No hiatal hernia. No confluent airspace disease, pleural fluid, or pneumothorax within visualized lung. Abdomen/pelvis: Unremarkable appearance of liver and spleen. Unremarkable appearance of bilateral  adrenal glands. Cholecystectomy. Unremarkable pancreas. No intrahepatic or extrahepatic biliary ductal dilatation. No intra-peritoneal free air or significant free-fluid. No abnormally dilated small bowel or colon. No transition point. No inflammatory changes of the mesenteries. Normal appendix identified. No diverticular disease. Right Kidney/Ureter: No hydronephrosis. No nephrolithiasis. No perinephric stranding. Unremarkable course of the right ureter. Left Kidney/Ureter: No hydronephrosis. No nephrolithiasis. No perinephric stranding. Unremarkable course of the left ureter. Unremarkable appearance of the urinary bladder. Bilateral tubal ligation. Unremarkable appearance of the uterus. Physiologic changes of the adnexa. No significant vascular calcification. No aneurysm or periaortic fluid identified. Musculoskeletal: No displaced fracture identified. No significant degenerative changes of the spine. IMPRESSION: No acute finding.  Specifically, no evidence of nephrolithiasis. Physiologic changes of the left adnexa. Signed, Yvone NeuJaime S. Loreta AveWagner, DO [facility] Electronically Signed   By: Gilmer MorJaime  Wagner D.O.   On: 05/01/2016 13:11   I have personally reviewed and evaluated these images and lab results as part of  my medical decision-making.   EKG Interpretation None      MDM   Final diagnoses:  Flank pain    Possibly passed a kidney stone. Patient's pain control here. Heart rate improved with fluids and pain control. Secondary is no obvious abnormality on her CT scan will not give prescriptions for any narcotic medicine at home. Will return here for any new or worsening symptoms.  New Prescriptions: Discharge Medication List as of 05/01/2016  1:43 PM       I have personally and contemperaneously reviewed labs and imaging and used in my decision making as above.   A medical screening exam was performed and I feel the patient has had an  appropriate workup for their chief complaint at this time and likelihood of emergent condition existing is low and thus workup can continue on an outpatient basis.. Their vital signs are stable. They have been counseled on decision, discharge, follow up and which symptoms necessitate immediate return to the emergency department.  They verbally stated understanding and agreement with plan and discharged in stable condition.    I personally performed the services described in this documentation, which was scribed in my presence. The recorded information has been reviewed and is accurate.     Marily Memos, MD 05/02/16 [phone]

## 2016-05-01 NOTE — ED Notes (Signed)
Left flank pain times 2 days  Difficulty urinating.

## 2016-05-01 NOTE — ED Notes (Signed)
EDP at bedside  

## 2016-05-02 LAB — URINE CULTURE

## 2017-11-26 ENCOUNTER — Encounter (HOSPITAL_COMMUNITY): Payer: Self-pay | Admitting: Emergency Medicine

## 2017-11-26 ENCOUNTER — Other Ambulatory Visit: Payer: Self-pay

## 2017-11-26 ENCOUNTER — Emergency Department (HOSPITAL_COMMUNITY)
Admission: EM | Admit: 2017-11-26 | Discharge: 2017-11-27 | Disposition: A | Discharge status: Other Acute Inpatient Hospital | Payer: Medicaid Other | Attending: Emergency Medicine | Admitting: Emergency Medicine

## 2017-11-26 DIAGNOSIS — R45851 Suicidal ideations: Secondary | ICD-10-CM | POA: Insufficient documentation

## 2017-11-26 DIAGNOSIS — F329 Major depressive disorder, single episode, unspecified: Secondary | ICD-10-CM | POA: Insufficient documentation

## 2017-11-26 DIAGNOSIS — Z046 Encounter for general psychiatric examination, requested by authority: Secondary | ICD-10-CM | POA: Diagnosis present

## 2017-11-26 DIAGNOSIS — F1120 Opioid dependence, uncomplicated: Secondary | ICD-10-CM | POA: Diagnosis not present

## 2017-11-26 DIAGNOSIS — L0231 Cutaneous abscess of buttock: Secondary | ICD-10-CM | POA: Insufficient documentation

## 2017-11-26 DIAGNOSIS — F17210 Nicotine dependence, cigarettes, uncomplicated: Secondary | ICD-10-CM | POA: Insufficient documentation

## 2017-11-26 DIAGNOSIS — L0291 Cutaneous abscess, unspecified: Secondary | ICD-10-CM

## 2017-11-26 DIAGNOSIS — F321 Major depressive disorder, single episode, moderate: Secondary | ICD-10-CM | POA: Diagnosis not present

## 2017-11-26 LAB — COMPREHENSIVE METABOLIC PANEL
ALT: 24 U/L (ref 14–54)
AST: 25 U/L (ref 15–41)
Albumin: 3.6 g/dL (ref 3.5–5.0)
Alkaline Phosphatase: 125 U/L (ref 38–126)
Anion gap: 10 (ref 5–15)
BUN: 9 mg/dL (ref 6–20)
CHLORIDE: 104 mmol/L (ref 101–111)
CO2: 26 mmol/L (ref 22–32)
CREATININE: 0.72 mg/dL (ref 0.44–1.00)
Calcium: 8.9 mg/dL (ref 8.9–10.3)
Glucose, Bld: 123 mg/dL — ABNORMAL HIGH (ref 65–99)
POTASSIUM: 3.7 mmol/L (ref 3.5–5.1)
Sodium: 140 mmol/L (ref 135–145)
Total Bilirubin: 0.1 mg/dL — ABNORMAL LOW (ref 0.3–1.2)
Total Protein: 6.8 g/dL (ref 6.5–8.1)

## 2017-11-26 LAB — RAPID URINE DRUG SCREEN, HOSP PERFORMED
AMPHETAMINES: POSITIVE — AB
BENZODIAZEPINES: NOT DETECTED
Barbiturates: NOT DETECTED
Cocaine: NOT DETECTED
OPIATES: POSITIVE — AB
Tetrahydrocannabinol: NOT DETECTED

## 2017-11-26 LAB — CBC WITH DIFFERENTIAL/PLATELET
Basophils Absolute: 0 10*3/uL (ref 0.0–0.1)
Basophils Relative: 0 %
EOS ABS: 0.3 10*3/uL (ref 0.0–0.7)
Eosinophils Relative: 3 %
HCT: 35.5 % — ABNORMAL LOW (ref 36.0–46.0)
Hemoglobin: 11.4 g/dL — ABNORMAL LOW (ref 12.0–15.0)
LYMPHS ABS: 3.5 10*3/uL (ref 0.7–4.0)
Lymphocytes Relative: 37 %
MCH: 29.3 pg (ref 26.0–34.0)
MCHC: 32.1 g/dL (ref 30.0–36.0)
MCV: 91.3 fL (ref 78.0–100.0)
MONOS PCT: 10 %
Monocytes Absolute: 1 10*3/uL (ref 0.1–1.0)
Neutro Abs: 4.6 10*3/uL (ref 1.7–7.7)
Neutrophils Relative %: 50 %
Platelets: 238 10*3/uL (ref 150–400)
RBC: 3.89 MIL/uL (ref 3.87–5.11)
RDW: 13.7 % (ref 11.5–15.5)
WBC: 9.4 10*3/uL (ref 4.0–10.5)

## 2017-11-26 LAB — SALICYLATE LEVEL: Salicylate Lvl: <7 mg/dL (ref 2.8–30.0)

## 2017-11-26 LAB — ACETAMINOPHEN LEVEL

## 2017-11-26 LAB — PREGNANCY, URINE: Preg Test, Ur: NEGATIVE

## 2017-11-26 LAB — ETHANOL

## 2017-11-26 MED ORDER — LOPERAMIDE HCL 2 MG PO CAPS: 2.0000 mg | ORAL_CAPSULE | ORAL | Status: DC | PRN

## 2017-11-26 MED ORDER — CLONIDINE HCL 0.1 MG PO TABS
0.1000 mg | ORAL_TABLET | Freq: Four times a day (QID) | ORAL | Status: DC
  Administered 2017-11-26: 0.1 mg via ORAL
  Filled 2017-11-26: qty 1

## 2017-11-26 MED ORDER — CLONIDINE HCL 0.1 MG PO TABS: 0.1000 mg | ORAL_TABLET | Freq: Two times a day (BID) | ORAL | Status: DC

## 2017-11-26 MED ORDER — ONDANSETRON 4 MG PO TBDP: 4.0000 mg | ORAL_TABLET | Freq: Four times a day (QID) | ORAL | Status: DC | PRN

## 2017-11-26 MED ORDER — DICYCLOMINE HCL 20 MG PO TABS
20.0000 mg | ORAL_TABLET | Freq: Four times a day (QID) | ORAL | Status: DC | PRN
  Filled 2017-11-26: qty 1

## 2017-11-26 MED ORDER — NAPROXEN 250 MG PO TABS: 500.0000 mg | ORAL_TABLET | Freq: Two times a day (BID) | ORAL | Status: DC | PRN

## 2017-11-26 MED ORDER — HYDROXYZINE HCL 25 MG PO TABS: 25.0000 mg | ORAL_TABLET | Freq: Four times a day (QID) | ORAL | Status: DC | PRN

## 2017-11-26 MED ORDER — CLONIDINE HCL 0.1 MG PO TABS: 0.1000 mg | ORAL_TABLET | Freq: Every day | ORAL | Status: DC

## 2017-11-26 MED ORDER — LIDOCAINE HCL (PF) 1 % IJ SOLN
INTRAMUSCULAR | Status: AC
  Administered 2017-11-26: 5 mL via INTRADERMAL
  Filled 2017-11-26: qty 5

## 2017-11-26 MED ORDER — METHOCARBAMOL 500 MG PO TABS: 500.0000 mg | ORAL_TABLET | Freq: Three times a day (TID) | ORAL | Status: DC | PRN

## 2017-11-26 MED ORDER — CLINDAMYCIN HCL 150 MG PO CAPS
300.0000 mg | ORAL_CAPSULE | Freq: Four times a day (QID) | ORAL | Status: DC
  Administered 2017-11-26: 300 mg via ORAL
  Filled 2017-11-26: qty 2

## 2017-11-26 MED ORDER — LIDOCAINE HCL (PF) 1 % IJ SOLN
5.0000 mL | Freq: Once | INTRAMUSCULAR | Status: AC
  Administered 2017-11-26: 5 mL via INTRADERMAL

## 2017-11-26 NOTE — ED Provider Notes (Signed)
Pt with left buttock abscess and here for med clearance, SI and need for narcotic detox.  Asked by Dr Ranae PalmsYelverton to I & D abscess  INCISION AND DRAINAGE Performed by: Burgess AmorIDOL, Shonette Rhames Consent: Verbal consent obtained. Risks and benefits: risks, benefits and alternatives were discussed Type: abscess  Body area: left mid buttock.  Pt has a 3 cm area of induration without drainage and another 4 cm surrounding erythema. No active drainage.  Anesthesia: local infiltration  Incision was made with a scalpel.  Local anesthetic: lidocaine 1% without  epinephrine  Anesthetic total: 4 ml  Complexity: complex Blunt dissection to break up loculations.    Drainage: purulent  Drainage amount: moderate  Packing material: 1/4 in iodoform gauze  Patient tolerance: Patient tolerated the procedure well with no immediate complications.      Burgess Amordol, Jatavious Peppard, PA-C 11/26/17 2120    Loren RacerYelverton, David, MD 11/27/17 [phone]

## 2017-11-26 NOTE — ED Notes (Signed)
[facility]

## 2017-11-26 NOTE — ED Provider Notes (Signed)
[facility]  For substance abuse and vague suicidal ideation.  [facility]  Will recheck blood pressure to make sure that it is okay.  Then [facility].   Vanetta MuldersZackowski, Asiah Browder, MD 11/26/17 [phone]

## 2017-11-26 NOTE — ED Triage Notes (Signed)
Pt reports has "abscess on butt", SI without a plan, and wants detox from heroin. Pt alert and calm in triage. nad noted.
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
## 2017-11-26 NOTE — ED Triage Notes (Signed)
Pt here for abscess on buttocks  She also states that she is here for med clearance and placement for heroin and narcotic abuse  Using 1/2 gm daily IV Last detox 9 years ago with 5 years clean

## 2017-11-26 NOTE — ED Provider Notes (Signed)
[facility] EMERGENCY DEPARTMENT Provider Note   CSN: [id number] Arrival date & time: 11/26/17  1818     History   Chief Complaint Chief Complaint  Patient presents with  . V70.1    HPI Alexandra Ball is a 37 y.o. female.  HPI Patient states she is abusing heroin and would like detox.  Last use this morning.  Occasionally uses methadone but none recently.  Denies any alcohol use.  Complains of several days of abscess on the buttocks which she attempted to drain with a needle.  Area is warm but no fever or chills.  Patient also endorses vague suicidal ideation.  States she has feelings of worthlessness.  No previous suicide attempts or plans. Past Medical History:  Diagnosis Date  . Bipolar disorder (HCC)   . Carpal tunnel syndrome   . Noncompliance with medication regimen   . Seizures (HCC)    mild one 6months ago, off meds for pregnancy. Was on lamictil and dilatin  . Substance abuse (HCC)    Multiple substances    Patient Active Problem List   Diagnosis Date Noted  . S/P tubal ligation 07/02/2011  . Postpartum care following vaginal delivery 06/30/2011    Past Surgical History:  Procedure Laterality Date  . CHOLECYSTECTOMY OPEN  2006   during second pregnancy  . OVARIAN CYST REMOVAL    . TUBAL LIGATION  07/01/2011   Procedure: POST PARTUM TUBAL LIGATION;  Surgeon: Scheryl Darter, MD;  Location: [facility];  Service: Gynecology;  Laterality: Bilateral;  With Filshie Clips     OB History    Gravida Para Term Preterm AB Living   6 3 3  0 2 3   SAB TAB Ectopic Multiple Live Births   2 0 0 0 3       Home Medications    Prior to Admission medications   Not on File    Family History History reviewed. No pertinent family history.  Social History Social History   Tobacco Use  . Smoking status: Current Every Day Smoker    Packs/day: 1.00    Years: 10.00    Pack years: 10.00    Types: Cigarettes  . Smokeless tobacco: Never Used  Substance Use Topics  .  Alcohol use: No    Comment: previos hx of drug abuse admitted to behavioral health in 2010; states occasionally on 03/06/12  . Drug use: No     Allergies   Ultram [tramadol hcl]   Review of Systems Review of Systems  Constitutional: Negative for chills and fever.  Respiratory: Negative for shortness of breath.   Cardiovascular: Negative for chest pain.  Gastrointestinal: Negative for abdominal pain, diarrhea, nausea and vomiting.  Genitourinary: Negative for dysuria, flank pain and frequency.  Musculoskeletal: Negative for back pain and myalgias.  Neurological: Negative for dizziness, weakness, numbness and headaches.  Psychiatric/Behavioral: Positive for suicidal ideas.  All other systems reviewed and are negative.    Physical Exam Updated Vital Signs BP (!) 147/107 (BP Location: Right Arm)   Pulse 98   Temp 98.2 F (36.8 C) (Oral)   Resp 20   Ht 5\' 2"  (1.575 m)   Wt 68 kg (150 lb)   LMP 11/19/2017   SpO2 100%   BMI 27.44 kg/m   Physical Exam  Constitutional: She is oriented to person, place, and time. She appears well-developed and well-nourished.  HENT:  Head: Normocephalic and atraumatic.  Mouth/Throat: Oropharynx is clear and moist.  Eyes: EOM are normal. Pupils are equal,  round, and reactive to light.  Neck: Normal range of motion. Neck supple.  Cardiovascular: Normal rate and regular rhythm.  Pulmonary/Chest: Effort normal and breath sounds normal.  Abdominal: Soft. Bowel sounds are normal. There is no tenderness. There is no rebound and no guarding.  Musculoskeletal: Normal range of motion. She exhibits no edema or tenderness.  Neurological: She is alert and oriented to person, place, and time.  Skin: Skin is warm and dry. Capillary refill takes less than 2 seconds. No rash noted. No erythema.  Psychiatric: She has a normal mood and affect. Her behavior is normal.  Nursing note and vitals reviewed.    ED Treatments / Results  Labs (all labs ordered are  listed, but only abnormal results are displayed) Labs Reviewed  COMPREHENSIVE METABOLIC PANEL - Abnormal; Notable for the following components:      Result Value   Glucose, Bld 123 (*)    Total Bilirubin 0.1 (*)    All other components within normal limits  RAPID URINE DRUG SCREEN, HOSP PERFORMED - Abnormal; Notable for the following components:   Opiates POSITIVE (*)    Amphetamines POSITIVE (*)    All other components within normal limits  CBC WITH DIFFERENTIAL/PLATELET - Abnormal; Notable for the following components:   Hemoglobin 11.4 (*)    HCT 35.5 (*)    All other components within normal limits  ACETAMINOPHEN LEVEL - Abnormal; Notable for the following components:   Acetaminophen (Tylenol), Serum <10 (*)    All other components within normal limits  ETHANOL  SALICYLATE LEVEL  PREGNANCY, URINE    EKG  EKG Interpretation None       Radiology No results found.  Procedures Procedures (including critical care time)  Medications Ordered in ED Medications  cloNIDine (CATAPRES) tablet 0.1 mg (not administered)    Followed by  cloNIDine (CATAPRES) tablet 0.1 mg (not administered)    Followed by  cloNIDine (CATAPRES) tablet 0.1 mg (not administered)  dicyclomine (BENTYL) tablet 20 mg (not administered)  hydrOXYzine (ATARAX/VISTARIL) tablet 25 mg (not administered)  loperamide (IMODIUM) capsule 2-4 mg (not administered)  methocarbamol (ROBAXIN) tablet 500 mg (not administered)  naproxen (NAPROSYN) tablet 500 mg (not administered)  ondansetron (ZOFRAN-ODT) disintegrating tablet 4 mg (not administered)  clindamycin (CLEOCIN) capsule 300 mg (not administered)  lidocaine (PF) (XYLOCAINE) 1 % injection 5 mL (5 mLs Intradermal Given 11/26/17 2049)     Initial Impression / Assessment and Plan / ED Course  I have reviewed the triage vital signs and the nursing notes.  Pertinent labs & imaging results that were available during my care of the patient were reviewed by me and  considered in my medical decision making (see chart for details).     Abscess drained and packed by Burgess AmorJulie Idol, PA.  See procedure note will start on antibiotics.  Will need to have packing removed in 2 days.  Patient is medically cleared for psychiatric evaluation.  Final Clinical Impressions(s) / ED Diagnoses   Final diagnoses:  Heroin dependence (HCC)  Suicidal ideation  Abscess    ED Discharge Orders    None       Loren RacerYelverton, Roddy Bellamy, MD 11/26/17 2121

## 2017-11-27 ENCOUNTER — Encounter (HOSPITAL_COMMUNITY): Payer: Self-pay

## 2017-11-27 ENCOUNTER — Inpatient Hospital Stay (HOSPITAL_COMMUNITY)
Admission: AD | Admit: 2017-11-27 | Discharge: 2017-12-02 | DRG: 885 | Disposition: A | Discharge status: Home / Self Care | Payer: Medicaid Other | Attending: Psychiatry | Admitting: Psychiatry

## 2017-11-27 DIAGNOSIS — Z91410 Personal history of adult physical and sexual abuse: Secondary | ICD-10-CM | POA: Diagnosis not present

## 2017-11-27 DIAGNOSIS — Z598 Other problems related to housing and economic circumstances: Secondary | ICD-10-CM | POA: Diagnosis not present

## 2017-11-27 DIAGNOSIS — J3489 Other specified disorders of nose and nasal sinuses: Secondary | ICD-10-CM | POA: Diagnosis not present

## 2017-11-27 DIAGNOSIS — R450 Nervousness: Secondary | ICD-10-CM

## 2017-11-27 DIAGNOSIS — F1110 Opioid abuse, uncomplicated: Secondary | ICD-10-CM | POA: Diagnosis present

## 2017-11-27 DIAGNOSIS — R768 Other specified abnormal immunological findings in serum: Secondary | ICD-10-CM | POA: Diagnosis present

## 2017-11-27 DIAGNOSIS — Z560 Unemployment, unspecified: Secondary | ICD-10-CM

## 2017-11-27 DIAGNOSIS — R45851 Suicidal ideations: Secondary | ICD-10-CM | POA: Diagnosis present

## 2017-11-27 DIAGNOSIS — F17210 Nicotine dependence, cigarettes, uncomplicated: Secondary | ICD-10-CM | POA: Diagnosis not present

## 2017-11-27 DIAGNOSIS — F1510 Other stimulant abuse, uncomplicated: Secondary | ICD-10-CM | POA: Diagnosis present

## 2017-11-27 DIAGNOSIS — F39 Unspecified mood [affective] disorder: Secondary | ICD-10-CM | POA: Diagnosis not present

## 2017-11-27 DIAGNOSIS — Z885 Allergy status to narcotic agent status: Secondary | ICD-10-CM

## 2017-11-27 DIAGNOSIS — F4010 Social phobia, unspecified: Secondary | ICD-10-CM | POA: Diagnosis not present

## 2017-11-27 DIAGNOSIS — G4700 Insomnia, unspecified: Secondary | ICD-10-CM | POA: Diagnosis not present

## 2017-11-27 DIAGNOSIS — Z653 Problems related to other legal circumstances: Secondary | ICD-10-CM

## 2017-11-27 DIAGNOSIS — F1910 Other psychoactive substance abuse, uncomplicated: Secondary | ICD-10-CM | POA: Diagnosis not present

## 2017-11-27 DIAGNOSIS — F332 Major depressive disorder, recurrent severe without psychotic features: Principal | ICD-10-CM | POA: Diagnosis present

## 2017-11-27 DIAGNOSIS — K5900 Constipation, unspecified: Secondary | ICD-10-CM | POA: Diagnosis not present

## 2017-11-27 DIAGNOSIS — Z9049 Acquired absence of other specified parts of digestive tract: Secondary | ICD-10-CM

## 2017-11-27 DIAGNOSIS — F1990 Other psychoactive substance use, unspecified, uncomplicated: Secondary | ICD-10-CM | POA: Diagnosis not present

## 2017-11-27 DIAGNOSIS — F419 Anxiety disorder, unspecified: Secondary | ICD-10-CM | POA: Diagnosis not present

## 2017-11-27 DIAGNOSIS — Z9851 Tubal ligation status: Secondary | ICD-10-CM | POA: Diagnosis not present

## 2017-11-27 MED ORDER — LOPERAMIDE HCL 2 MG PO CAPS: 2.0000 mg | ORAL_CAPSULE | ORAL | Status: AC | PRN

## 2017-11-27 MED ORDER — CLINDAMYCIN HCL 300 MG PO CAPS
300.0000 mg | ORAL_CAPSULE | Freq: Four times a day (QID) | ORAL | Status: AC
  Administered 2017-11-27 – 2017-11-30 (×12): 300 mg via ORAL
  Filled 2017-11-27 (×13): qty 1

## 2017-11-27 MED ORDER — NICOTINE 21 MG/24HR TD PT24
21.0000 mg | MEDICATED_PATCH | Freq: Every day | TRANSDERMAL | Status: DC
  Administered 2017-11-27 – 2017-12-02 (×6): 21 mg via TRANSDERMAL
  Filled 2017-11-27 (×9): qty 1

## 2017-11-27 MED ORDER — ONDANSETRON 4 MG PO TBDP
4.0000 mg | ORAL_TABLET | Freq: Four times a day (QID) | ORAL | Status: AC | PRN
  Administered 2017-11-27 – 2017-11-28 (×4): 4 mg via ORAL
  Filled 2017-11-27 (×4): qty 1

## 2017-11-27 MED ORDER — CLONIDINE HCL 0.1 MG PO TABS
0.1000 mg | ORAL_TABLET | Freq: Four times a day (QID) | ORAL | Status: AC
  Administered 2017-11-27 – 2017-11-28 (×8): 0.1 mg via ORAL
  Filled 2017-11-27 (×8): qty 1

## 2017-11-27 MED ORDER — BUPROPION HCL ER (XL) 150 MG PO TB24
150.0000 mg | ORAL_TABLET | Freq: Every day | ORAL | Status: DC
  Administered 2017-11-28 – 2017-11-29 (×2): 150 mg via ORAL
  Filled 2017-11-27 (×4): qty 1

## 2017-11-27 MED ORDER — ACETAMINOPHEN 325 MG PO TABS
650.0000 mg | ORAL_TABLET | Freq: Four times a day (QID) | ORAL | Status: DC | PRN
  Administered 2017-11-27: 650 mg via ORAL
  Filled 2017-11-27: qty 2

## 2017-11-27 MED ORDER — METHOCARBAMOL 500 MG PO TABS
500.0000 mg | ORAL_TABLET | Freq: Three times a day (TID) | ORAL | Status: AC | PRN
  Administered 2017-11-27 – 2017-11-29 (×6): 500 mg via ORAL
  Filled 2017-11-27 (×6): qty 1

## 2017-11-27 MED ORDER — CLONIDINE HCL 0.1 MG PO TABS
0.1000 mg | ORAL_TABLET | Freq: Every day | ORAL | Status: DC
  Administered 2017-12-01: 0.1 mg via ORAL
  Filled 2017-11-27 (×2): qty 1

## 2017-11-27 MED ORDER — TRAZODONE HCL 50 MG PO TABS
50.0000 mg | ORAL_TABLET | Freq: Every evening | ORAL | Status: DC | PRN
  Administered 2017-11-27 – 2017-11-28 (×3): 50 mg via ORAL
  Filled 2017-11-27 (×3): qty 1

## 2017-11-27 MED ORDER — ALUM & MAG HYDROXIDE-SIMETH 200-200-20 MG/5ML PO SUSP: 30.0000 mL | ORAL | Status: DC | PRN

## 2017-11-27 MED ORDER — CLONIDINE HCL 0.1 MG PO TABS
0.1000 mg | ORAL_TABLET | ORAL | Status: AC
  Administered 2017-11-29 – 2017-11-30 (×4): 0.1 mg via ORAL
  Filled 2017-11-27 (×4): qty 1

## 2017-11-27 MED ORDER — ARIPIPRAZOLE 5 MG PO TABS
5.0000 mg | ORAL_TABLET | Freq: Every day | ORAL | Status: DC
  Administered 2017-11-28 – 2017-12-02 (×5): 5 mg via ORAL
  Filled 2017-11-27 (×8): qty 1

## 2017-11-27 MED ORDER — DICYCLOMINE HCL 20 MG PO TABS
20.0000 mg | ORAL_TABLET | Freq: Four times a day (QID) | ORAL | Status: AC | PRN
  Administered 2017-11-28 – 2017-12-01 (×5): 20 mg via ORAL
  Filled 2017-11-27 (×5): qty 1

## 2017-11-27 MED ORDER — MAGNESIUM HYDROXIDE 400 MG/5ML PO SUSP
30.0000 mL | Freq: Every day | ORAL | Status: DC | PRN
  Administered 2017-11-29: 30 mL via ORAL
  Filled 2017-11-27: qty 30

## 2017-11-27 MED ORDER — HYDROXYZINE HCL 25 MG PO TABS
25.0000 mg | ORAL_TABLET | Freq: Four times a day (QID) | ORAL | Status: DC | PRN
  Administered 2017-11-27 – 2017-11-29 (×8): 25 mg via ORAL
  Filled 2017-11-27 (×9): qty 1

## 2017-11-27 MED ORDER — NAPROXEN 500 MG PO TABS
500.0000 mg | ORAL_TABLET | Freq: Two times a day (BID) | ORAL | Status: AC | PRN
  Administered 2017-11-27 – 2017-11-30 (×6): 500 mg via ORAL
  Filled 2017-11-27 (×6): qty 1

## 2017-11-27 NOTE — Progress Notes (Signed)
Psychoeducational Group Note  Date:  11/27/2017 Time:  2045  Group Topic/Focus:  wrap up group  Participation Level: Did Not Attend  Participation Quality:  Not Applicable  Affect:  Not Applicable  Cognitive:  Not Applicable  Insight:  Not Applicable  Engagement in Group: Not Applicable  Additional Comments:  Pt was notified that group was beginning but remained in bed.   Marcille BuffyMcNeil, Desirae Mancusi S 11/27/2017, 9:18 PM

## 2017-11-27 NOTE — Tx Team (Signed)
Initial Treatment Plan 11/27/2017 1:18 AM Alexandra Ball ZOX:[id number]    PATIENT STRESSORS: Financial difficulties Substance abuse   PATIENT STRENGTHS: Capable of independent living Communication skills General fund of knowledge Motivation for treatment/growth Supportive family/friends   PATIENT IDENTIFIED PROBLEMS: Suicidal ideation  Substance abuse  "I want to go to a longer rehab program"                 DISCHARGE CRITERIA:  Improved stabilization in mood, thinking, and/or behavior Verbal commitment to aftercare and medication compliance Withdrawal symptoms are absent or subacute and managed without 24-hour nursing intervention  PRELIMINARY DISCHARGE PLAN: Outpatient therapy Medication management  PATIENT/FAMILY INVOLVEMENT: This treatment plan has been presented to and reviewed with the patient, Alexandra Ball.  The patient and family have been given the opportunity to ask questions and make suggestions.  Levin BaconHeather V Nicolo Tomko, RN 11/27/2017, 1:18 AM
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
## 2017-11-27 NOTE — [facility] Group Notes (Signed)
[facility] Group Notes: (Clinical Social Work)   11/27/2017      Type of Therapy:  Group Therapy   Participation Level:  Did Not Attend despite MHT prompting   Rogerick Baldwin Grossman-Orr, LCSW 11/27/2017, 11:50 AM     

## 2017-11-27 NOTE — Progress Notes (Signed)
Pt reported during admission that she was told to leave the dressing to her left buttock intact for 2 days, remove dressing and remove packing.  Encouraged her to let MD know tomorrow.
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
## 2017-11-27 NOTE — Progress Notes (Signed)
D    Pt is pleasant on approach and cooperative   She reports mild to moderate withdrawal symptoms    She interacts appropriately with others  A   Verbal support given   Medications administered and effectiveness monitored   Q 15 min checks R  Pt remains safe
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
## 2017-11-27 NOTE — [facility] Group Notes (Signed)
Identifying Needs   Date:  11/27/2017  Time:  7:50 PM  Type of Therapy:  Nurse Education   /  The group focuses on teaching patients how to identify their needs  And then how ot develop sklills needed to get them met.   Participation Level:  Did Not Attend  Participation Quality:    Affect:    Cognitive:    Insight:    Engagement in Group:    Modes of Intervention:    Summary of Progress/Problems:  Lauralyn Primes 11/27/2017, 7:50 PM

## 2017-11-27 NOTE — Progress Notes (Signed)
D) Pt has  Been in the bed much of the shift. States that she is feeling poorly and "detoxing is hard". Wants to detox due to losing her children and wants to get them back. Affect is flat and mood depressed. States, "I just need to sleep it off right now". A) Provided with a 1:1. Given support, reassurance and praise. Encouragement given.  R) Denies SI and HI. Committed to sobriety.

## 2017-11-28 DIAGNOSIS — F1990 Other psychoactive substance use, unspecified, uncomplicated: Secondary | ICD-10-CM

## 2017-11-28 LAB — HIV ANTIBODY (ROUTINE TESTING W REFLEX): HIV Screen 4th Generation wRfx: NONREACTIVE

## 2017-11-28 LAB — TSH: TSH: 0.767 u[IU]/mL (ref 0.350–4.500)

## 2017-11-28 NOTE — Progress Notes (Signed)
D) Pt has had a hard time with detox ing today, but is feeling committed to stopping her drug use to obtain custody of her children again.  Has requested several prn's for her withdrawal. Pt's cows was a 6. In a 1:1 Pt talked about her children that were surrendered to child protective services last month. States that she has never detox ed from heroin before and is finding is very hard. Pt rates her depression at a 10, hopelessness at a 7 and her anxiety at a 10 A) given support with active listening. Provided with a 1:1. Given encouragement and educated about the detox process. Praised for her willingness to detox. R) Pt denies SI and HI. Having a difficult time today.

## 2017-11-28 NOTE — Progress Notes (Signed)
[facility] MD Progress Note  11/28/2017 11:17 AM Alexandra Ball  MRN:  [id number]   Subjective: Alexandra Ball reports " I think I need something stronger to help me detox"  Objective: Alexandra Ball is awake, alert and oriented. Seen resting in dayroom interacting with peers. Reports muscle aches and headaches. States she is aware of some discomfort from detoxing in the past.  Denies suicidal or homicidal ideation. Denies auditory or visual hallucination and does not appear to be responding to internal stimuli. Patient interacts well with staff and others. continues to reports depression. Reports tolerating Wellbutrin and Abilify well. Alexandra Ball states she is medication compliant without mediation side effects. Reports good appetite and states she is resting well. Support, encouragement and reassurance was provided.   Principal Problem: Severe recurrent major depression without psychotic features (HCC) Diagnosis:   Patient Active Problem List   Diagnosis Date Noted  . Severe recurrent major depression without psychotic features (HCC) [F33.2] 11/27/2017  . Substance use disorder [F19.90] 11/27/2017  . S/P tubal ligation [Z98.51] 07/02/2011  . Postpartum care following vaginal delivery [Z39.2] 06/30/2011   Total Time spent with patient: 20 minutes  Past Psychiatric History:   Past Medical History:  Past Medical History:  Diagnosis Date  . Bipolar disorder (HCC)   . Carpal tunnel syndrome   . Noncompliance with medication regimen   . Seizures (HCC)    mild one 6months ago, off meds for pregnancy. Was on lamictil and dilatin  . Substance abuse (HCC)    Multiple substances    Past Surgical History:  Procedure Laterality Date  . CHOLECYSTECTOMY OPEN  2006   during second pregnancy  . OVARIAN CYST REMOVAL    . TUBAL LIGATION  07/01/2011   Procedure: POST PARTUM TUBAL LIGATION;  Surgeon: Scheryl Darter, MD;  Location: [facility];  Service: Gynecology;  Laterality: Bilateral;  With Filshie Clips     Family History: History reviewed. No pertinent family history. Family Psychiatric  History:  Social History:  Social History   Substance and Sexual Activity  Alcohol Use No   Comment: previos hx of drug abuse admitted to behavioral health in 2010; states occasionally on 03/06/12     Social History   Substance and Sexual Activity  Drug Use No    Social History   Socioeconomic History  . Marital status: Married    Spouse name: None  . Number of children: None  . Years of education: None  . Highest education level: None  Social Needs  . Financial resource strain: None  . Food insecurity - worry: None  . Food insecurity - inability: None  . Transportation needs - medical: None  . Transportation needs - non-medical: None  Occupational History  . None  Tobacco Use  . Smoking status: Current Every Day Smoker    Packs/day: 1.00    Years: 10.00    Pack years: 10.00    Types: Cigarettes  . Smokeless tobacco: Never Used  Substance and Sexual Activity  . Alcohol use: No    Comment: previos hx of drug abuse admitted to behavioral health in 2010; states occasionally on 03/06/12  . Drug use: No  . Sexual activity: Yes    Birth control/protection: Surgical    Comment: denies   Other Topics Concern  . None  Social History Narrative  . None   Additional Social History:  Sleep: Fair  Appetite:  Fair  Current Medications: Current Facility-Administered Medications  Medication Dose Route Frequency Provider Last Rate Last Dose  . acetaminophen (TYLENOL) tablet 650 mg  650 mg Oral Q6H PRN Nira ConnBerry, Jason A, NP   650 mg at 11/27/17 1554  . alum & mag hydroxide-simeth (MAALOX/MYLANTA) 200-200-20 MG/5ML suspension 30 mL  30 mL Oral Q4H PRN Nira ConnBerry, Jason A, NP      . ARIPiprazole (ABILIFY) tablet 5 mg  5 mg Oral Daily Izediuno, Delight OvensVincent A, MD   5 mg at 11/28/17 0837  . buPROPion (WELLBUTRIN XL) 24 hr tablet 150 mg  150 mg Oral Daily Oneta RackLewis, Jayvan Mcshan N, NP    150 mg at 11/28/17 0837  . clindamycin (CLEOCIN) capsule 300 mg  300 mg Oral Q6H Nira ConnBerry, Jason A, NP   300 mg at 11/28/17 0541  . cloNIDine (CATAPRES) tablet 0.1 mg  0.1 mg Oral QID Nira ConnBerry, Jason A, NP   0.1 mg at 11/28/17 [id number]   Followed by  . [START ON 11/29/2017] cloNIDine (CATAPRES) tablet 0.1 mg  0.1 mg Oral BH-qamhs Jackelyn PolingBerry, Jason A, NP       Followed by  . [START ON 12/01/2017] cloNIDine (CATAPRES) tablet 0.1 mg  0.1 mg Oral QAC breakfast Nira ConnBerry, Jason A, NP      . dicyclomine (BENTYL) tablet 20 mg  20 mg Oral Q6H PRN Nira ConnBerry, Jason A, NP   20 mg at 11/28/17 0837  . hydrOXYzine (ATARAX/VISTARIL) tablet 25 mg  25 mg Oral Q6H PRN Nira ConnBerry, Jason A, NP   25 mg at 11/28/17 0837  . loperamide (IMODIUM) capsule 2-4 mg  2-4 mg Oral PRN Nira ConnBerry, Jason A, NP      . magnesium hydroxide (MILK OF MAGNESIA) suspension 30 mL  30 mL Oral Daily PRN Nira ConnBerry, Jason A, NP      . methocarbamol (ROBAXIN) tablet 500 mg  500 mg Oral Q8H PRN Nira ConnBerry, Jason A, NP   500 mg at 11/28/17 0837  . naproxen (NAPROSYN) tablet 500 mg  500 mg Oral BID PRN Nira ConnBerry, Jason A, NP   500 mg at 11/28/17 0542  . nicotine (NICODERM CQ - dosed in mg/24 hours) patch 21 mg  21 mg Transdermal Daily Cobos, Rockey SituFernando A, MD   21 mg at 11/28/17 [id number]  . ondansetron (ZOFRAN-ODT) disintegrating tablet 4 mg  4 mg Oral Q6H PRN Nira ConnBerry, Jason A, NP   4 mg at 11/28/17 0048  . traZODone (DESYREL) tablet 50 mg  50 mg Oral QHS PRN Jackelyn PolingBerry, Jason A, NP   50 mg at 11/27/17 2110    Lab Results:  Results for orders placed or performed during the hospital encounter of 11/27/17 (from the past 48 hour(s))  TSH     Status: None   Collection Time: 11/28/17  6:34 AM  Result Value Ref Range   TSH 0.767 0.350 - 4.500 uIU/mL    Comment: Performed by a 3rd Generation assay with a functional sensitivity of <=0.01 uIU/mL. Performed at [facility], [address] [place], [place] [address]     Blood Alcohol level:  Lab Results  Component Value Date   ETH <10  11/26/2017   [facility]  [facility]    <5        LOWEST DETECTABLE LIMIT FOR SERUM ALCOHOL IS 5 mg/dL FOR MEDICAL PURPOSES ONLY    Metabolic Disorder Labs: No results found for: HGBA1C, MPG No results found for: PROLACTIN No results found for: CHOL, TRIG, HDL, CHOLHDL, VLDL, LDLCALC  Physical Findings: AIMS: Facial and Oral Movements Muscles of Facial Expression: None, normal Lips and Perioral Area: None, normal Jaw: None, normal Tongue: None, normal,Extremity Movements Upper (arms, wrists, hands, fingers): None, normal Lower (legs, knees, ankles, toes): None, normal, Trunk Movements Neck, shoulders, hips: None, normal, Overall Severity Severity of abnormal movements (highest score from questions above): None, normal Incapacitation due to abnormal movements: None, normal Patient's awareness of abnormal movements (rate only patient's report): No Awareness, Dental Status Current problems with teeth and/or dentures?: No Does patient usually wear dentures?: No  CIWA:    COWS:  COWS Total Score: 0  Musculoskeletal: Strength & Muscle Tone: within normal limits Gait & Station: normal Patient leans: N/A  Psychiatric Specialty Exam: Physical Exam  Constitutional: She appears well-developed.  HENT:  Head: Normocephalic.  Respiratory: Effort normal.  Psychiatric: She has a normal mood and affect. Her behavior is normal.    Review of Systems  Psychiatric/Behavioral: Positive for depression and substance abuse. Negative for suicidal ideas. The patient is nervous/anxious and has insomnia.   All other systems reviewed and are negative.   Blood pressure 102/76, pulse 82, temperature 98.6 F (37 C), temperature source Oral, resp. rate 18, height 5\' 3"  (1.6 m), weight 66.7 kg (147 lb), last menstrual period 11/19/2017.Body mass index is 26.04 kg/m.  General Appearance: Casual and Guarded  Eye Contact:  Good  Speech:  Clear and Coherent  Volume:  Normal  Mood:  Anxious  Affect:   Congruent  Thought Process:  Coherent  Orientation:  Full (Time, Place, and Person)  Thought Content:  Hallucinations: None  Suicidal Thoughts:  No  Homicidal Thoughts:  No  Memory:  Immediate;   Fair Recent;   Fair Remote;   Fair  Judgement:  Fair  Insight:  Fair  Psychomotor Activity:  Normal  Concentration:  Concentration: Fair  Recall:  [employer] of Knowledge:  Fair  Language:  Fair  Akathisia:  No  Handed:  Right  AIMS (if indicated):     Assets:  Communication Skills Desire for Improvement Financial Resources/Insurance Resilience Social Support  ADL's:  Intact  Cognition:  WNL  Sleep:  Number of Hours: 3.75     Treatment Plan Summary: Daily contact with patient to assess and evaluate symptoms and progress in treatment and Medication management   Continue with current treatment plan on 11/28/2017 except where noted  Contiune Abilify 5mg  PO QD for mood stabilzation Continue Wellbutrin 150 mg QD for  mood stabilization. Continue with Trazodone 50 mg for insomnia  Will continue to monitor vitals ,medication compliance and treatment side effects while patient is here.  Reviewed labs:,BAL - , UDS - positive opiates and amphetamines   CSW will start working on disposition.  Patient to participate in therapeutic milieu     Oneta Rack, NP 11/28/2017, 11:17 AM
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
## 2017-11-28 NOTE — Progress Notes (Signed)
Patient did not attend the evening speaker AA meeting. Pt was notified that group was beginning but remained in bed during group time.   

## 2017-11-28 NOTE — [facility] Group Notes (Signed)
[facility] LCSW Group Therapy Note  Date/Time:  11/28/2017 9:00-10:00AM  Type of Therapy and Topic:  Group Therapy:  Healthy and Unhealthy Supports  Participation Level:  Active   Description of Group:  Patients in this group were introduced to the idea of adding a variety of healthy supports to address the various needs in their lives. The picture on the front of Sunday's workbook was used to demonstrate why more supports are needed in every patient's life.  Patients identified and described healthy supports versus unhealthy supports in general, then gave examples of each in their own lives.   They discussed what additional healthy supports could be helpful in their recovery and wellness after discharge in order to prevent future hospitalizations.   An emphasis was placed on using counselor, doctor, therapy groups, 12-step groups, and problem-specific support groups to expand supports.  They also worked as a group on developing a specific plan for several patients to deal with unhealthy supports through boundary-setting, psychoeducation with loved ones, and even termination of relationships.   Therapeutic Goals:   1)  discuss importance of adding supports to stay well once out of the hospital  2)  compare healthy versus unhealthy supports and identify some examples of each  3)  generate ideas and descriptions of healthy supports that can be added  4)  offer mutual support about how to address unhealthy supports  5)  encourage active participation in and adherence to discharge plan    Summary of Patient Progress: The patient expressed a willingness to add a doctor and medications to help in her recovery journey in 2019, as well as to work on reducing or eliminating her own self-medication and self-destruction in her life this year to better pursue wellness.  Her participation was minimal but she showed insight when she did speak or was asked questions.  Therapeutic Modalities:   Motivational  Interviewing Brief Solution-Focused Therapy  Ambrose MantleMareida Grossman-Orr, LCSW

## 2017-11-29 LAB — HEPATITIS C ANTIBODY: HCV Ab: >11 s/co ratio — ABNORMAL HIGH (ref 0.0–0.9)

## 2017-11-29 MED ORDER — BISACODYL 5 MG PO TBEC
5.0000 mg | DELAYED_RELEASE_TABLET | Freq: Every day | ORAL | Status: DC | PRN
  Administered 2017-11-29: 5 mg via ORAL
  Filled 2017-11-29: qty 1

## 2017-11-29 MED ORDER — TRAZODONE HCL 100 MG PO TABS
100.0000 mg | ORAL_TABLET | Freq: Every evening | ORAL | Status: DC | PRN
  Administered 2017-11-29: 100 mg via ORAL
  Filled 2017-11-29: qty 1

## 2017-11-29 MED ORDER — BUPROPION HCL ER (XL) 300 MG PO TB24
300.0000 mg | ORAL_TABLET | Freq: Every day | ORAL | Status: DC
  Administered 2017-11-30: 300 mg via ORAL
  Filled 2017-11-29 (×3): qty 1

## 2017-11-29 MED ORDER — HYDROXYZINE HCL 50 MG PO TABS
50.0000 mg | ORAL_TABLET | Freq: Four times a day (QID) | ORAL | Status: AC | PRN
  Administered 2017-11-29 – 2017-12-01 (×9): 50 mg via ORAL
  Filled 2017-11-29 (×9): qty 1

## 2017-11-29 NOTE — Progress Notes (Signed)
CSW made [facility] referral 11/29/2017 12:05 PM. Pt to be [facility] and [facility] information as well.   Trula SladeHeather Smart, MSW, LCSW Clinical Social Worker 11/29/2017 12:05 PM

## 2017-11-29 NOTE — Progress Notes (Signed)
Recreation Therapy Notes  Date: 11/29/17 Time: 0930 Location: 300 Hall Dayroom  Group Topic: Stress Management  Goal Area(s) Addresses:  Patient will verbalize importance of using healthy stress management.  Patient will identify positive emotions associated with healthy stress management.   Intervention: Stress Management  Activity :  LRT introduced the stress management technique of meditation.  LRT played a meditation to help patients deal with anxiety.  Patients were to follow along to fully engage in the meditation.  Education:  Stress Management, Discharge Planning.   Education Outcome: Acknowledges edcuation/In group clarification offered/Needs additional education  Clinical Observations/Feedback: Pt did not attend group.    Emmalise Huard, LRT/CTRS         Chasity Outten A 11/29/2017 11:17 AM 

## 2017-11-29 NOTE — [facility] Group Notes (Signed)
[facility] Group Therapy 11/29/2017 1:15pm  Type of Therapy: Mental Health Association Presentation  Participation Level: Invited. DID NOT ATTEND. Invited. Chose to remain in bed to rest.   Summary of Progress/Problems: Mental Health Association [facility](MHA) Speaker came to talk about his personal journey with mental health. The pt processed ways by which to relate to the speaker. MHA speaker provided handouts and educational information pertaining to groups and services offered by the [facility]. Pt was engaged in speaker's presentation and was receptive to resources provided.    Pulte HomesHeather N Smart, LCSW 11/29/2017 1:41 PM

## 2017-11-29 NOTE — Tx Team (Signed)
Interdisciplinary Treatment and Diagnostic Plan Update  11/29/2017 Time of Session: 0830AM Alexandra Ball MRN: [id number]  Principal Diagnosis: Severe recurrent major depression without psychotic features [facility])  Secondary Diagnoses: Principal Problem:   Severe recurrent major depression without psychotic features (HCC) Active Problems:   Substance use disorder   Current Medications:  Current Facility-Administered Medications  Medication Dose Route Frequency Provider Last Rate Last Dose  . acetaminophen (TYLENOL) tablet 650 mg  650 mg Oral Q6H PRN Nira Conn A, NP   650 mg at 11/27/17 1554  . alum & mag hydroxide-simeth (MAALOX/MYLANTA) 200-200-20 MG/5ML suspension 30 mL  30 mL Oral Q4H PRN Nira Conn A, NP      . ARIPiprazole (ABILIFY) tablet 5 mg  5 mg Oral Daily Izediuno, Delight Ovens, MD   5 mg at 11/29/17 0831  . buPROPion (WELLBUTRIN XL) 24 hr tablet 150 mg  150 mg Oral Daily Oneta Rack, NP   150 mg at 11/29/17 0831  . clindamycin (CLEOCIN) capsule 300 mg  300 mg Oral Q6H Nira Conn A, NP   300 mg at 11/29/17 0603  . cloNIDine (CATAPRES) tablet 0.1 mg  0.1 mg Oral BH-qamhs Nira Conn A, NP   0.1 mg at 11/29/17 4098   Followed by  . [START ON 12/01/2017] cloNIDine (CATAPRES) tablet 0.1 mg  0.1 mg Oral QAC breakfast Nira Conn A, NP      . dicyclomine (BENTYL) tablet 20 mg  20 mg Oral Q6H PRN Nira Conn A, NP   20 mg at 11/28/17 2138  . hydrOXYzine (ATARAX/VISTARIL) tablet 25 mg  25 mg Oral Q6H PRN Nira Conn A, NP   25 mg at 11/29/17 0431  . loperamide (IMODIUM) capsule 2-4 mg  2-4 mg Oral PRN Nira Conn A, NP      . magnesium hydroxide (MILK OF MAGNESIA) suspension 30 mL  30 mL Oral Daily PRN Nira Conn A, NP   30 mL at 11/29/17 1038  . methocarbamol (ROBAXIN) tablet 500 mg  500 mg Oral Q8H PRN Nira Conn A, NP   500 mg at 11/29/17 0836  . naproxen (NAPROSYN) tablet 500 mg  500 mg Oral BID PRN Nira Conn A, NP   500 mg at 11/28/17 2138  . nicotine (NICODERM  CQ - dosed in mg/24 hours) patch 21 mg  21 mg Transdermal Daily Cobos, Rockey Situ, MD   21 mg at 11/29/17 0832  . ondansetron (ZOFRAN-ODT) disintegrating tablet 4 mg  4 mg Oral Q6H PRN Nira Conn A, NP   4 mg at 11/28/17 2139  . traZODone (DESYREL) tablet 50 mg  50 mg Oral QHS PRN Nira Conn A, NP   50 mg at 11/28/17 2139   PTA Medications: No medications prior to admission.    Patient Stressors: Financial difficulties Substance abuse  Patient Strengths: Capable of independent living Wellsite geologist fund of knowledge Motivation for treatment/growth Supportive family/friends  Treatment Modalities: Medication Management, Group therapy, Case management,  1 to 1 session with clinician, Psychoeducation, Recreational therapy.   Physician Treatment Plan for Primary Diagnosis: Severe recurrent major depression without psychotic features (HCC) Long Term Goal(s): Improvement in symptoms so as ready for discharge Improvement in symptoms so as ready for discharge   Short Term Goals: Ability to identify changes in lifestyle to reduce recurrence of condition will improve Ability to disclose and discuss suicidal ideas Ability to maintain clinical measurements within normal limits will improve Compliance with prescribed medications will improve Ability to verbalize feelings will improve  Ability to disclose and discuss suicidal ideas Ability to demonstrate self-control will improve Ability to identify and develop effective coping behaviors will improve Ability to identify triggers associated with substance abuse/mental health issues will improve  Medication Management: Evaluate patient's response, side effects, and tolerance of medication regimen.  Therapeutic Interventions: 1 to 1 sessions, Unit Group sessions and Medication administration.  Evaluation of Outcomes: Progressing  Physician Treatment Plan for Secondary Diagnosis: Principal Problem:   Severe recurrent major  depression without psychotic features (HCC) Active Problems:   Substance use disorder  Long Term Goal(s): Improvement in symptoms so as ready for discharge Improvement in symptoms so as ready for discharge   Short Term Goals: Ability to identify changes in lifestyle to reduce recurrence of condition will improve Ability to disclose and discuss suicidal ideas Ability to maintain clinical measurements within normal limits will improve Compliance with prescribed medications will improve Ability to verbalize feelings will improve Ability to disclose and discuss suicidal ideas Ability to demonstrate self-control will improve Ability to identify and develop effective coping behaviors will improve Ability to identify triggers associated with substance abuse/mental health issues will improve     Medication Management: Evaluate patient's response, side effects, and tolerance of medication regimen.  Therapeutic Interventions: 1 to 1 sessions, Unit Group sessions and Medication administration.  Evaluation of Outcomes: Progressing   RN Treatment Plan for Primary Diagnosis: Severe recurrent major depression without psychotic features (HCC) Long Term Goal(s): Knowledge of disease and therapeutic regimen to maintain health will improve  Short Term Goals: Ability to remain free from injury will improve, Ability to disclose and discuss suicidal ideas and Ability to identify and develop effective coping behaviors will improve  Medication Management: RN will administer medications as ordered by provider, will assess and evaluate patient's response and provide education to patient for prescribed medication. RN will report any adverse and/or side effects to prescribing provider.  Therapeutic Interventions: 1 on 1 counseling sessions, Psychoeducation, Medication administration, Evaluate responses to treatment, Monitor vital signs and CBGs as ordered, Perform/monitor CIWA, COWS, AIMS and Fall Risk screenings  as ordered, Perform wound care treatments as ordered.  Evaluation of Outcomes: Progressing   LCSW Treatment Plan for Primary Diagnosis: Severe recurrent major depression without psychotic features (HCC) Long Term Goal(s): Safe transition to appropriate next level of care at discharge, Engage patient in therapeutic group addressing interpersonal concerns.  Short Term Goals: Engage patient in aftercare planning with referrals and resources, Facilitate patient progression through stages of change regarding substance use diagnoses and concerns and Identify triggers associated with mental health/substance abuse issues  Therapeutic Interventions: Assess for all discharge needs, 1 to 1 time with Social worker, Explore available resources and support systems, Assess for adequacy in community support network, Educate family and significant other(s) on suicide prevention, Complete Psychosocial Assessment, Interpersonal group therapy.  Evaluation of Outcomes: Progressing   Progress in Treatment: Attending groups: Yes. Participating in groups: Yes. Taking medication as prescribed: Yes. Toleration medication: Yes. Family/Significant other contact made: No, will contact:  mother in law Gina  Patient understands diagnosis: Yes. Discussing patient identified problems/goals with staff: Yes. Medical problems stabilized or resolved: Yes. Denies suicidal/homicidal ideation: Yes. Issues/concerns per patient self-inventory: No. Other: n/a   New problem(s) identified: No, Describe:  n/a  New Short Term/Long Term Goal(s): detox, medication management for mood stabilization, elimination of SI thoughts, development of comprehensive mental wellness/sobriety plan.   Discharge Plan or Barriers: CSW assessing for appropriate referrals. Pt is interested in 2-3 months rehab program. She  has [employer]. Pt reports having seizure disorder and upcoming court dates which may interfere with ability to  pursue residential. [facility] a possibility.   Reason for Continuation of Hospitalization: Anxiety Depression Medication stabilization Suicidal ideation Withdrawal symptoms  Estimated Length of Stay: Wed 12/01/17  Attendees: Patient: 11/29/2017 10:51 AM  Physician: Dr. Altamese Walden MD; Dr. Jackquline Berlin MD 11/29/2017 10:51 AM  Nursing: Earl Lagos RN 11/29/2017 10:51 AM  RN Care Manager: x 11/29/2017 10:51 AM  Social Worker: Trula Slade, LCSW 11/29/2017 10:51 AM  Recreational Therapist: x 11/29/2017 10:51 AM  Other: Hillery Jacks NP; Feliz Beam Money NP 11/29/2017 10:51 AM  Other:  11/29/2017 10:51 AM  Other: 11/29/2017 10:51 AM    Scribe for Treatment Team: Ledell Peoples Smart, LCSW 11/29/2017 10:51 AM

## 2017-11-29 NOTE — Progress Notes (Signed)
Data. Patient denies SI/HI/AVH. Verbally contracts for safety on the unit and to come to staff before acting of any self harm thoughts/feelings.  Patient interacting well with staff and other patients. Affect is flat and only brightens minimally with interaction. She reports continued detox symptoms. Patient states she took the dressing off her buttocks wound and the packing out yesterday and there was some drainage at that time, but there has not been any since. She reports that she did not tell any staff, though she did ask for a band aid. Wound was assessed, cleansed and covered. No pain at the sight. No drainage, redness, or swelling.  There was a lump underneath the wound, deep in the soft tissue of the buttocks, of approximately quarter sized and not painful. NP notified of findings. On her self assessment she reports 6/10 for depression and hopelessness and 7/10 for hopelessness. She has no goal today.  Action. Emotional support and encouragement offered. Education provided on medication, indications and side effect. Q 15 minute checks done for safety. Response. Safety on the unit maintained through 15 minute checks.  Medications taken as prescribed. Attended groups. Remained calm and appropriate through out shift.

## 2017-11-29 NOTE — Progress Notes (Signed)
D.  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  Pt continues to report moderate withdrawal symptoms.  Pt did not feel well enough to attend evening AA group, remained in room.  Pt did get up for snacks but soon returned to bed.  Pt denies SI/HI/AVH at this time.  A.  Support and encouragement offered, medication given as ordered.  R. Pt remains safe on the unit, will continue to monitor.

## 2017-11-29 NOTE — Progress Notes (Signed)
[facility] MD Progress Note  11/29/2017 2:53 PM Alexandra Ball  MRN:  [id number]   Subjective:  Patient reports that she is having severe anxiety still. She reports her anxiety at 8/10 and her depression at 4/10. Patient states that she has been having some difficulty sleeping as well. She is hoping that with medications then she will see some improvements. She also reports some constipation and would like something other than Milk of Mag for it. Patient denies any SI/HI/AVH and contracts for safety.   Objective: Patient's chart and findings reviewed with treatment team. Patient presents in the milieu and is going to the gym and has been cooperative and pleasant. Will have the Wellbutrin XL 300 mg, increase Trazodone to 100 mg QHS PRN, and Vistaril to 50 mg PRN. Will also order Dulcolax for constipation and encouraged patient to increase water intake.  Principal Problem: Severe recurrent major depression without psychotic features (HCC) Diagnosis:   Patient Active Problem List   Diagnosis Date Noted  . Severe recurrent major depression without psychotic features (HCC) [F33.2] 11/27/2017  . Substance use disorder [F19.90] 11/27/2017  . S/P tubal ligation [Z98.51] 07/02/2011  . Postpartum care following vaginal delivery [Z39.2] 06/30/2011   Total Time spent with patient: 25 minutes  Past Psychiatric History: See H&P  Past Medical History:  Past Medical History:  Diagnosis Date  . Bipolar disorder (HCC)   . Carpal tunnel syndrome   . Noncompliance with medication regimen   . Seizures (HCC)    mild one 6months ago, off meds for pregnancy. Was on lamictil and dilatin  . Substance abuse (HCC)    Multiple substances    Past Surgical History:  Procedure Laterality Date  . CHOLECYSTECTOMY OPEN  2006   during second pregnancy  . OVARIAN CYST REMOVAL    . TUBAL LIGATION  07/01/2011   Procedure: POST PARTUM TUBAL LIGATION;  Surgeon: Scheryl Darter, MD;  Location: [facility];  Service: Gynecology;   Laterality: Bilateral;  With Filshie Clips    Family History: History reviewed. No pertinent family history. Family Psychiatric  History: See H&P Social History:  Social History   Substance and Sexual Activity  Alcohol Use No   Comment: previos hx of drug abuse admitted to behavioral health in 2010; states occasionally on 03/06/12     Social History   Substance and Sexual Activity  Drug Use No    Social History   Socioeconomic History  . Marital status: Married    Spouse name: None  . Number of children: None  . Years of education: None  . Highest education level: None  Social Needs  . Financial resource strain: None  . Food insecurity - worry: None  . Food insecurity - inability: None  . Transportation needs - medical: None  . Transportation needs - non-medical: None  Occupational History  . None  Tobacco Use  . Smoking status: Current Every Day Smoker    Packs/day: 1.00    Years: 10.00    Pack years: 10.00    Types: Cigarettes  . Smokeless tobacco: Never Used  Substance and Sexual Activity  . Alcohol use: No    Comment: previos hx of drug abuse admitted to behavioral health in 2010; states occasionally on 03/06/12  . Drug use: No  . Sexual activity: Yes    Birth control/protection: Surgical    Comment: denies   Other Topics Concern  . None  Social History Narrative  . None   Additional Social History:  Sleep: Good  Appetite:  Good  Current Medications: Current Facility-Administered Medications  Medication Dose Route Frequency Provider Last Rate Last Dose  . acetaminophen (TYLENOL) tablet 650 mg  650 mg Oral Q6H PRN Nira Conn A, NP   650 mg at 11/27/17 1554  . alum & mag hydroxide-simeth (MAALOX/MYLANTA) 200-200-20 MG/5ML suspension 30 mL  30 mL Oral Q4H PRN Nira Conn A, NP      . ARIPiprazole (ABILIFY) tablet 5 mg  5 mg Oral Daily Izediuno, Delight Ovens, MD   5 mg at 11/29/17 0831  . bisacodyl (DULCOLAX) EC tablet 5  mg  5 mg Oral Daily PRN Courteney Alderete, Gerlene Burdock, FNP   5 mg at 11/29/17 1202  . [START ON 11/30/2017] buPROPion (WELLBUTRIN XL) 24 hr tablet 300 mg  300 mg Oral Daily Earleen Aoun B, FNP      . clindamycin (CLEOCIN) capsule 300 mg  300 mg Oral Q6H Nira Conn A, NP   300 mg at 11/29/17 1158  . cloNIDine (CATAPRES) tablet 0.1 mg  0.1 mg Oral BH-qamhs Nira Conn A, NP   0.1 mg at 11/29/17 0981   Followed by  . [START ON 12/01/2017] cloNIDine (CATAPRES) tablet 0.1 mg  0.1 mg Oral QAC breakfast Nira Conn A, NP      . dicyclomine (BENTYL) tablet 20 mg  20 mg Oral Q6H PRN Nira Conn A, NP   20 mg at 11/28/17 2138  . hydrOXYzine (ATARAX/VISTARIL) tablet 50 mg  50 mg Oral Q6H PRN Tannisha Kennington, Gerlene Burdock, FNP   50 mg at 11/29/17 1202  . loperamide (IMODIUM) capsule 2-4 mg  2-4 mg Oral PRN Nira Conn A, NP      . magnesium hydroxide (MILK OF MAGNESIA) suspension 30 mL  30 mL Oral Daily PRN Nira Conn A, NP   30 mL at 11/29/17 1038  . methocarbamol (ROBAXIN) tablet 500 mg  500 mg Oral Q8H PRN Nira Conn A, NP   500 mg at 11/29/17 0836  . naproxen (NAPROSYN) tablet 500 mg  500 mg Oral BID PRN Nira Conn A, NP   500 mg at 11/28/17 2138  . nicotine (NICODERM CQ - dosed in mg/24 hours) patch 21 mg  21 mg Transdermal Daily Cobos, Rockey Situ, MD   21 mg at 11/29/17 0832  . ondansetron (ZOFRAN-ODT) disintegrating tablet 4 mg  4 mg Oral Q6H PRN Nira Conn A, NP   4 mg at 11/28/17 2139  . traZODone (DESYREL) tablet 100 mg  100 mg Oral QHS PRN Jamien Casanova, Gerlene Burdock, FNP        Lab Results:  Results for orders placed or performed during the hospital encounter of 11/27/17 (from the past 48 hour(s))  Hepatitis C antibody     Status: Abnormal   Collection Time: 11/27/17  6:30 PM  Result Value Ref Range   HCV Ab >11.0 (H) 0.0 - 0.9 s/co ratio    Comment: (NOTE)                                  Negative:     < 0.8                             Indeterminate: 0.8 - 0.9  Positive:     > 0.9 The  CDC recommends that a positive HCV antibody result be followed up with a HCV Nucleic Acid Amplification test ([id number]). Performed At: [facility] [address] [place], [place] [id number] Jolene SchimkeNagendra Sanjai MD ON:[phone]:[phone] Performed at [facility], [address] [place], [place] [address]   TSH     Status: None   Collection Time: 11/28/17  6:34 AM  Result Value Ref Range   TSH 0.767 0.350 - 4.500 uIU/mL    Comment: Performed by a 3rd Generation assay with a functional sensitivity of <=0.01 uIU/mL. Performed at [facility], [address] [place], [place] [address]   HIV antibody     Status: None   Collection Time: 11/28/17  6:34 AM  Result Value Ref Range   HIV Screen 4th Generation wRfx Non Reactive Non Reactive    Comment: (NOTE) Performed At: [facility] [address] [place], [place] [id number] Jolene SchimkeNagendra Sanjai MD QV:[phone]:[phone] Performed at [facility], [address] [place], [place] [address]     Blood Alcohol level:  Lab Results  Component Value Date   ETH <10 11/26/2017   [facility]    <5        LOWEST DETECTABLE LIMIT FOR SERUM ALCOHOL IS 5 mg/dL FOR MEDICAL PURPOSES ONLY    Metabolic Disorder Labs: No results found for: HGBA1C, MPG No results found for: PROLACTIN No results found for: CHOL, TRIG, HDL, CHOLHDL, VLDL, LDLCALC  Physical Findings: AIMS: Facial and Oral Movements Muscles of Facial Expression: None, normal Lips and Perioral Area: None, normal Jaw: None, normal Tongue: None, normal,Extremity Movements Upper (arms, wrists, hands, fingers): None, normal Lower (legs, knees, ankles, toes): None, normal, Trunk Movements Neck, shoulders, hips: None, normal, Overall Severity Severity of abnormal movements (highest score from questions above): None, normal Incapacitation due to abnormal movements: None, normal Patient's awareness of abnormal movements (rate only  patient's report): No Awareness, Dental Status Current problems with teeth and/or dentures?: No Does patient usually wear dentures?: No  CIWA:    COWS:  COWS Total Score: 6  Musculoskeletal: Strength & Muscle Tone: within normal limits Gait & Station: normal Patient leans: N/A  Psychiatric Specialty Exam: Physical Exam  Nursing note and vitals reviewed. Constitutional: She is oriented to person, place, and time. She appears well-developed.  Cardiovascular: Normal rate.  Respiratory: Effort normal.  Musculoskeletal: Normal range of motion.  Neurological: She is oriented to person, place, and time.  Skin: Skin is warm.    Review of Systems  Constitutional: Negative.   HENT: Negative.   Eyes: Negative.   Respiratory: Negative.   Cardiovascular: Negative.   Gastrointestinal: Negative.   Genitourinary: Negative.   Musculoskeletal: Negative.   Skin: Negative.   Neurological: Negative.   Endo/Heme/Allergies: Negative.   Psychiatric/Behavioral: Positive for depression. The patient is nervous/anxious.     Blood pressure 112/74, pulse 81, temperature 98.9 F (37.2 C), temperature source Oral, resp. rate 14, height 5\' 3"  (1.6 m), weight 66.7 kg (147 lb), last menstrual period 11/19/2017.Body mass index is 26.04 kg/m.  General Appearance: Casual  Eye Contact:  Good  Speech:  Clear and Coherent and Normal Rate  Volume:  Normal  Mood:  Depressed  Affect:  Depressed and Flat  Thought Process:  Goal Directed and Descriptions of Associations: Intact  Orientation:  Full (Time, Place, and Person)  Thought Content:  WDL  Suicidal Thoughts:  No  Homicidal Thoughts:  No  Memory:  Immediate;   Good Recent;   Good Remote;   Good  Judgement:  Good  Insight:  Good  Psychomotor Activity:  Normal  Concentration:  Concentration: Good and Attention Span: Good  Recall:  Good  Fund of Knowledge:  Good  Language:  Good  Akathisia:  No  Handed:  Right  AIMS (if indicated):     Assets:   Communication Skills Desire for Improvement Financial Resources/Insurance Housing Social Support  ADL's:  Intact  Cognition:  WNL  Sleep:  Number of Hours: 4.25   Problems Addressed: MDD severe SUD  Treatment Plan Summary: Daily contact with patient to assess and evaluate symptoms and progress in treatment, Medication management and Plan is to:  -Continue Abilify 5 mg PO Daily for mood stability -Increase Wellbutrin XL 300 mg PO Daily for mood stability -Increase Trazodone 100 mg PO QHS PRN for insomnia -Increase Vistaril 50 mg PO Q6H PRN for anxiety -Continue Clonidine Detox Protocol -Encourage group therapy participation  Maryfrances Bunnell, FNP 11/29/2017, 2:53 PM

## 2017-11-30 DIAGNOSIS — J3489 Other specified disorders of nose and nasal sinuses: Secondary | ICD-10-CM

## 2017-11-30 MED ORDER — MIRTAZAPINE 15 MG PO TABS
15.0000 mg | ORAL_TABLET | Freq: Every day | ORAL | Status: DC
  Administered 2017-11-30 – 2017-12-01 (×2): 15 mg via ORAL
  Filled 2017-11-30 (×5): qty 1

## 2017-11-30 NOTE — Progress Notes (Signed)
Patient ID: Alexandra BartersKimberly J Ball, female   DOB: Oct 15, 1981, 37 y.o.   MRN: [id number]   D: Patient is pleasant on approach tonight. Reports still having some withdrawals from the drugs. Reports increased anxiety tonight. Vistaril given. Reports only depressed when she is doing drugs. Denies any current SI and contracts for safety. A: Staff will monitor on q 15 minute checks, follow treatment plan, and give meds as ordered. R: Cooperative on the unit.

## 2017-11-30 NOTE — [facility] Group Notes (Signed)
LCSW Group Therapy Note  11/30/2017 1:15pm  Type of Therapy/Topic:  Group Therapy:  Feelings about Diagnosis  Participation Level:  Active   Description of Group:   This group will allow patients to explore their thoughts and feelings about diagnoses they have received. Patients will be guided to explore their level of understanding and acceptance of these diagnoses. Facilitator will encourage patients to process their thoughts and feelings about the reactions of others to their diagnosis and will guide patients in identifying ways to discuss their diagnosis with significant others in their lives. This group will be process-oriented, with patients participating in exploration of their own experiences, giving and receiving support, and processing challenge from other group members.   Therapeutic Goals: 1. Patient will demonstrate understanding of diagnosis as evidenced by identifying two or more symptoms of the disorder 2. Patient will be able to express two feelings regarding the diagnosis 3. Patient will demonstrate their ability to communicate their needs through discussion and/or role play  Summary of Patient Progress:  Selena BattenKim was attentive and engaged during today's processing group. She shared that she agreed with her diagnosis and wanted to learn ways to cope with her mental illness "instead of self medicating with drugs." Selena BattenKim discussed her history with AA/NA and how she tends to be triggered by heraing stories about people's drug and alcohol use. She is interested in [facility] support groups rather than AA/NA. Selena BattenKim continues to demonstrate progress in the group setting with improving insight.    Therapeutic Modalities:   Cognitive Behavioral Therapy Brief Therapy Feelings Identification    Ledell PeoplesHeather N Smart, LCSW 11/30/2017 3:14 PM

## 2017-11-30 NOTE — Progress Notes (Signed)
D: Patient observed up and visible in the milieu. Patient states she is doing "okay." "My withdrawal symptoms are better but I'm still having some." Complained of abdominal cramping and anxiety before lunch. Also complained of L buttock pain from healing abscess rated at a 5/10. Patient's affect anxious with congruent mood. Per self inventory and discussions with writer, rates depression at a 0/10, hopelessness at a 4/10 and anxiety at a 3-8/10 (depending on disruptiveness in the milieu.) Rates sleep as fair, appetite as good, energy as normal and concentration as good.  States goal for today is "feeling great and going home. Go to groups and rec time and color."    A: Medicated per orders, prn naproxen, bentyl and vistaril given for complaints. Level III obs in place for safety. Emotional support offered and self inventory reviewed. Encouraged completion of Suicide Safety Plan and programming participation. Discussed POC with MD, SW.    R: Patient verbalizes understanding of POC. On reassess, patient reports pain has resolved and withdrawal symptoms have lessened. Patient denies SI/HI/AVH and remains safe on level III obs. Will continue to monitor closely and make verbal contact frequently.

## 2017-11-30 NOTE — Progress Notes (Signed)
[facility].  Trula SladeHeather Smart, MSW, LCSW Clinical Social Worker 11/30/2017 11:00 AM

## 2017-11-30 NOTE — Plan of Care (Signed)
Patient has not engaged in self harm, denies thoughts to do so.  Patient has been med compliant.

## 2017-11-30 NOTE — Progress Notes (Signed)
Recreation Therapy Notes  Animal-Assisted Activity (AAA) Program Checklist/Progress Notes Patient Eligibility Criteria Checklist & Daily Group note for Rec TxIntervention  Date: 01.18.2018 Time: 2:45pm [address]   AAA/T Program Assumption of Risk Form signed by Patient/ or Parent Legal Guardian Yes  Patient is free of allergies or sever asthma  Yes  Patient reports no fear of animals Yes  Patient reports no history of cruelty to animals Yes   Patient understands his/her participation is voluntary Yes  Patient washes hands before animal contact Yes  Patient washes hands after animal contact Yes  Behavioral Response: Did not attend.   Marykay Lexenise L Robley [place], LRT/CTRS       Jearl KlinefelterBlanchfield, Ruhan Borak L 11/30/2017 2:58 PM

## 2017-11-30 NOTE — Progress Notes (Signed)
[facility] MD Progress Note  11/30/2017 4:10 PM Alexandra Ball  MRN:  [id number]   Subjective:  Patient reports partial improvement , but reports lingering depression, anxiety. Reports ongoing cravings and states she feels she would be at high risk of relapse if not in a inpatient environment at this time, but expresses feeling motivated in sobriety. States " I am tired of using".  Reports lingering symptoms of WDL , mainly some persistent rhinorrhea, loose stools. Denies vomiting.  Objective:  I have discussed case with treatment team and have met with patient  Patient is 37 years old, presented due to worsening depression, substance use disorder ( opiates, amphetamines ). Reports she recently lost custody of her children. At this time reports lingering but overall improved symptoms of WDL, and does not appear to be in any acute distress or discomfort . As above, reports ongoing cravings but ongoing motivation in sobriety, abstinence- states she was hoping to go to a rehab at discharge but that due to court dates she may not be able to . Denies medication side effects- of note, patient reports history of prior seizures. We reviewed potential risks associated with Wellbutrin management in the context of this history, and agrees to change to another antidepressant trial- reports lingering limited appetite and chronic insomnia. Agrees to REMERON trial.  We reviewed labs- Hep C is (+), HIV is negative . We reviewed results and importance of following up with PCP/GI specialist for ongoing monitoring and management. Of note, LFTS are WNL. ( AST 25, ALT 24)    Principal Problem: Severe recurrent major depression without psychotic features ([facility]) Diagnosis:   Patient Active Problem List   Diagnosis Date Noted  . Severe recurrent major depression without psychotic features ([place]) [F33.2] 11/27/2017  . Substance use disorder [F19.90] 11/27/2017  . S/P tubal ligation [Z98.51] 07/02/2011  . Postpartum care  following vaginal delivery [Z39.2] 06/30/2011   Total Time spent with patient: 20 minutes  Past Psychiatric History: See H&P  Past Medical History:  Past Medical History:  Diagnosis Date  . Bipolar disorder ([place])   . Carpal tunnel syndrome   . Noncompliance with medication regimen   . Seizures ([place])    mild one 14month ago, off meds for pregnancy. Was on lamictil and dilatin  . [place]    Multiple substances    Past Surgical History:  Procedure Laterality Date  . CHOLECYSTECTOMY OPEN  2006   during second pregnancy  . OVARIAN CYST REMOVAL    . TUBAL LIGATION  07/01/2011   Procedure: POST PARTUM TUBAL LIGATION;  Surgeon: JEmeterio Reeve MD;  [facility];  Service: Gynecology;  Laterality: Bilateral;  With Filshie Clips    Family History: History reviewed. No pertinent family history. Family Psychiatric  History: See H&P Social History:  Social History   Substance and Sexual Activity  Alcohol Use No   Comment: previos hx of drug abuse admitted to behavioral health in 2010; states occasionally on 03/06/12     Social History   Substance and Sexual Activity  Drug Use No    Social History   Socioeconomic History  . Marital status: Married    Spouse name: None  . Number of children: None  . Years of education: None  . Highest education level: None  Social Needs  . Financial resource strain: None  . Food insecurity - worry: None  . Food insecurity - inability: None  . Transportation needs - medical: None  . Transportation needs - non-medical:  None  Occupational History  . None  Tobacco Use  . Smoking status: Current Every Day Smoker    Packs/day: 1.00    Years: 10.00    Pack years: 10.00    Types: Cigarettes  . Smokeless tobacco: Never Used  Substance and Sexual Activity  . Alcohol use: No    Comment: previos hx of drug abuse admitted to behavioral health in 2010; states occasionally on 03/06/12  . Drug use: No  . Sexual activity: Yes    Birth  control/protection: Surgical    Comment: denies   Other Topics Concern  . None  Social History Narrative  . None   Additional Social History:   Sleep: Good  Appetite:  Good  Current Medications: Current Facility-Administered Medications  Medication Dose Route Frequency Provider Last Rate Last Dose  . acetaminophen (TYLENOL) tablet 650 mg  650 mg Oral Q6H PRN Lindon Romp A, NP   650 mg at 11/27/17 1554  . alum & mag hydroxide-simeth (MAALOX/MYLANTA) 200-200-20 MG/5ML suspension 30 mL  30 mL Oral Q4H PRN Lindon Romp A, NP      . ARIPiprazole (ABILIFY) tablet 5 mg  5 mg Oral Daily Izediuno, Laruth Bouchard, MD   5 mg at 11/30/17 0804  . bisacodyl (DULCOLAX) EC tablet 5 mg  5 mg Oral Daily PRN Money, Lowry Ram, FNP   5 mg at 11/29/17 1202  . buPROPion (WELLBUTRIN XL) 24 hr tablet 300 mg  300 mg Oral Daily Money, Lowry Ram, FNP   300 mg at 11/30/17 0804  . cloNIDine (CATAPRES) tablet 0.1 mg  0.1 mg Oral BH-qamhs Lindon Romp A, NP   0.1 mg at 11/30/17 0804   Followed by  . [START ON 12/01/2017] cloNIDine (CATAPRES) tablet 0.1 mg  0.1 mg Oral QAC breakfast Lindon Romp A, NP      . dicyclomine (BENTYL) tablet 20 mg  20 mg Oral Q6H PRN Lindon Romp A, NP   20 mg at 11/30/17 1123  . hydrOXYzine (ATARAX/VISTARIL) tablet 50 mg  50 mg Oral Q6H PRN Money, Lowry Ram, FNP   50 mg at 11/30/17 1123  . loperamide (IMODIUM) capsule 2-4 mg  2-4 mg Oral PRN Lindon Romp A, NP      . magnesium hydroxide (MILK OF MAGNESIA) suspension 30 mL  30 mL Oral Daily PRN Lindon Romp A, NP   30 mL at 11/29/17 1038  . methocarbamol (ROBAXIN) tablet 500 mg  500 mg Oral Q8H PRN Lindon Romp A, NP   500 mg at 11/29/17 0836  . naproxen (NAPROSYN) tablet 500 mg  500 mg Oral BID PRN Lindon Romp A, NP   500 mg at 11/30/17 1123  . nicotine (NICODERM CQ - dosed in mg/24 hours) patch 21 mg  21 mg Transdermal Daily Paras Kreider, Myer Peer, MD   21 mg at 11/30/17 0805  . ondansetron (ZOFRAN-ODT) disintegrating tablet 4 mg  4 mg Oral Q6H PRN  Lindon Romp A, NP   4 mg at 11/28/17 2139  . traZODone (DESYREL) tablet 100 mg  100 mg Oral QHS PRN Money, Lowry Ram, FNP   100 mg at 11/29/17 2113    Lab Results:  No results found for this or any previous visit (from the past 48 hour(s)).  Blood Alcohol level:  Lab Results  Component Value Date   ETH <10 11/26/2017   ETH  09/15/2009    <5        LOWEST DETECTABLE LIMIT FOR SERUM ALCOHOL IS 5 mg/dL FOR MEDICAL  PURPOSES ONLY    Metabolic Disorder Labs: No results found for: HGBA1C, MPG No results found for: PROLACTIN No results found for: CHOL, TRIG, HDL, CHOLHDL, VLDL, LDLCALC  Physical Findings: AIMS: Facial and Oral Movements Muscles of Facial Expression: None, normal Lips and Perioral Area: None, normal Jaw: None, normal Tongue: None, normal,Extremity Movements Upper (arms, wrists, hands, fingers): None, normal Lower (legs, knees, ankles, toes): None, normal, Trunk Movements Neck, shoulders, hips: None, normal, Overall Severity Severity of abnormal movements (highest score from questions above): None, normal Incapacitation due to abnormal movements: None, normal Patient's awareness of abnormal movements (rate only patient's report): No Awareness, Dental Status Current problems with teeth and/or dentures?: No Does patient usually wear dentures?: No  CIWA:    COWS:  COWS Total Score: 3  Musculoskeletal: Strength & Muscle Tone: within normal limits Gait & Station: normal Patient leans: N/A  Psychiatric Specialty Exam: Physical Exam  Nursing note and vitals reviewed. Constitutional: She is oriented to person, place, and time. She appears well-developed.  Cardiovascular: Normal rate.  Respiratory: Effort normal.  Musculoskeletal: Normal range of motion.  Neurological: She is oriented to person, place, and time.  Skin: Skin is warm.    Review of Systems  Constitutional: Negative.   HENT: Negative.   Eyes: Negative.   Respiratory: Negative.   Cardiovascular:  Negative.   Gastrointestinal: Negative.   Genitourinary: Negative.   Musculoskeletal: Negative.   Skin: Negative.   Neurological: Negative.   Endo/Heme/Allergies: Negative.   Psychiatric/Behavioral: Positive for depression. The patient is nervous/anxious.   denies chest pain, no shortness of breath, reports intermittent nausea, no vomiting. No fever, no chills .   Blood pressure 114/74, pulse 94, temperature 98 F (36.7 C), temperature source Oral, resp. rate 16, height '5\' 3"'$  (1.6 m), weight 66.7 kg (147 lb), last menstrual period 11/19/2017.Body mass index is 26.04 kg/m.  General Appearance: Fairly Groomed  Eye Contact:  Good  Speech:  Normal Rate  Volume:  Normal  Mood:  partially improved mood, but remains depressed   Affect:  vaguely constricted, briefly tearful at times, but reactive   Thought Process:  Linear and Descriptions of Associations: Intact  Orientation:  Other:  fully alert and attentive   Thought Content:  denies hallucinations, no delusions, not internally preoccupied   Suicidal Thoughts:  No denies SI, denies self injurious ideations, contracts for safety on unit   Homicidal Thoughts:  No  Memory:  recent and remote grossly intact   Judgement:  Fair- improving   Insight:  Fair- improving   Psychomotor Activity:  no psychomotor agitation or restlessness   Concentration:  Concentration: Good and Attention Span: Good  Recall:  Good  Fund of Knowledge:  Good  Language:  Good  Akathisia:  No  Handed:  Right  AIMS (if indicated):     Assets:  Communication Skills Desire for Improvement Financial Resources/Insurance Housing Social Support  ADL's:  Intact  Cognition:  WNL  Sleep:  Number of Hours: 5   Assessment - Patient is presenting with partial improvement but presents vaguely depressed, sad, anxious.  Future oriented, and expressing frustration she may not be able to go to a rehab due to upcoming court dates . Denies SI.  Of note, Hep C (+) and we discussed  importance of adequate outpatient follow up for monitoring and to discuss treatment options . Based on history of seizures , will D/C Wellbutrin. Agrees to Remeron trial, which may also help her insomnia.  Treatment Plan Summary: Treatment Plan  reviewed as below today 1/8  Daily contact with patient to assess and evaluate symptoms and progress in treatment, Medication management and Plan is to:  -Encourage ongoing efforts to work on sobriety and relapse prevention -Continue Abilify 5 mg PO Daily for mood stability -D/C Wellbutrin  -Start Remeron 15 mgrs QHS for depression, insomnia -D/C Trazodone PRNs as starting Remeron  -Increase Vistaril 50 mg PO Q6H PRN for anxiety -Completing Clonidine Detox Protocol to address opiate WDL symptoms -Encourage group therapy participation to work on Radiographer, therapeutic and symptom reduction  Jenne Campus, MD 11/30/2017, 4:10 PM]  Patient ID: Alexandra Ball, female   DOB: [birth year], 37 y.o.   MRN: [id number]

## 2017-11-30 NOTE — Progress Notes (Signed)
Pt attended NA group this evening.  

## 2017-11-30 NOTE — Progress Notes (Signed)
CSW and pt met invidiually to discuss aftercare. Pt had phone interview with [facility] at 1pm today but is not currently eligible due to upcoming court dates on 1/14 and 1/15. Pt initially distraught about having to discharge home but spoke with CSW about [facility] as an option if [facility] could not guarantee her a bed on Wed 1/16. Pt provided with [employer] information and encouraged to call to schedule phone interview. Patient is hoping to stay until Sat/SUn to continue working on medication management and mood stabilization and to decrease her risk of discharging and relapsing prior to court next week. Pt also provided with [facility] application; at this time, not an option due to cost of program.   Maxie Better, MSW, LCSW Clinical Social Worker 11/30/2017 3:21 PM

## 2017-12-01 NOTE — Progress Notes (Signed)
[facility] MD Progress Note  12/01/2017 12:38 PM Alexandra Ball  MRN:  [id number]   Subjective:  Patient reports that she is doing good today. She slept better, but for some reason just can't sleep past 4 am. She denies any SI/HI/AVH and contracts for safety. She is concerned about discharging home and being alone, because she will start craving and using again. She is working on arrangements to stay with her mother-in-law until after her court dates and then go to residential treatment. She is concerned because she is looking at 4 felony charges if she accepts a plea, but then court may not go well either.   Objective: Patient's chart and findings reviewed and discussed with treatment team. Patient is pleasant and cooperative. She becomes tearful when discussing the 4 felonies she may be charged with. Will continue current medications at this time. Will plan discharge disposition in next 2 days.   Principal Problem: Severe recurrent major depression without psychotic features ([facility]) Diagnosis:   Patient Active Problem List   Diagnosis Date Noted  . Severe recurrent major depression without psychotic features ([facility]) [F33.2] 11/27/2017  . Substance use disorder [F19.90] 11/27/2017  . S/P tubal ligation [Z98.51] 07/02/2011  . Postpartum care following vaginal delivery [Z39.2] 06/30/2011   Total Time spent with patient: 15 minutes  Past Psychiatric History: See H&P  Past Medical History:  Past Medical History:  Diagnosis Date  . Bipolar disorder ([facility])   . Carpal tunnel syndrome   . Noncompliance with medication regimen   . Seizures ([facility])    mild one 6months ago, off meds for pregnancy. Was on lamictil and dilatin  . Substance abuse ([facility])    Multiple substances    Past Surgical History:  Procedure Laterality Date  . CHOLECYSTECTOMY OPEN  2006   during second pregnancy  . OVARIAN CYST REMOVAL    . TUBAL LIGATION  07/01/2011   Procedure: POST PARTUM TUBAL LIGATION;  Surgeon: Scheryl Darter, MD;   Location: [facility];  Service: Gynecology;  Laterality: Bilateral;  With Filshie Clips    Family History: History reviewed. No pertinent family history. Family Psychiatric  History: See H&P Social History:  Social History   Substance and Sexual Activity  Alcohol Use No   Comment: previos hx of drug abuse admitted to behavioral health in 2010; states occasionally on 03/06/12     Social History   Substance and Sexual Activity  Drug Use No    Social History   Socioeconomic History  . Marital status: Married    Spouse name: None  . Number of children: None  . Years of education: None  . Highest education level: None  Social Needs  . Financial resource strain: None  . Food insecurity - worry: None  . Food insecurity - inability: None  . Transportation needs - medical: None  . Transportation needs - non-medical: None  Occupational History  . None  Tobacco Use  . Smoking status: Current Every Day Smoker    Packs/day: 1.00    Years: 10.00    Pack years: 10.00    Types: Cigarettes  . Smokeless tobacco: Never Used  Substance and Sexual Activity  . Alcohol use: No    Comment: previos hx of drug abuse admitted to behavioral health in 2010; states occasionally on 03/06/12  . Drug use: No  . Sexual activity: Yes    Birth control/protection: Surgical    Comment: denies   Other Topics Concern  . None  Social History Narrative  .  None   Additional Social History:                         Sleep: Fair  Appetite:  Good  Current Medications: Current Facility-Administered Medications  Medication Dose Route Frequency Provider Last Rate Last Dose  . acetaminophen (TYLENOL) tablet 650 mg  650 mg Oral Q6H PRN Nira ConnBerry, Jason A, NP   650 mg at 11/27/17 1554  . alum & mag hydroxide-simeth (MAALOX/MYLANTA) 200-200-20 MG/5ML suspension 30 mL  30 mL Oral Q4H PRN Nira ConnBerry, Jason A, NP      . ARIPiprazole (ABILIFY) tablet 5 mg  5 mg Oral Daily Izediuno, Delight OvensVincent A, MD   5 mg at 12/01/17  0817  . bisacodyl (DULCOLAX) EC tablet 5 mg  5 mg Oral Daily PRN Money, Gerlene Burdockravis B, FNP   5 mg at 11/29/17 1202  . cloNIDine (CATAPRES) tablet 0.1 mg  0.1 mg Oral QAC breakfast Nira ConnBerry, Jason A, NP   0.1 mg at 12/01/17 0817  . dicyclomine (BENTYL) tablet 20 mg  20 mg Oral Q6H PRN Nira ConnBerry, Jason A, NP   20 mg at 12/01/17 1041  . hydrOXYzine (ATARAX/VISTARIL) tablet 50 mg  50 mg Oral Q6H PRN Money, Gerlene Burdockravis B, FNP   50 mg at 12/01/17 1041  . loperamide (IMODIUM) capsule 2-4 mg  2-4 mg Oral PRN Nira ConnBerry, Jason A, NP      . magnesium hydroxide (MILK OF MAGNESIA) suspension 30 mL  30 mL Oral Daily PRN Nira ConnBerry, Jason A, NP   30 mL at 11/29/17 1038  . methocarbamol (ROBAXIN) tablet 500 mg  500 mg Oral Q8H PRN Nira ConnBerry, Jason A, NP   500 mg at 11/29/17 0836  . mirtazapine (REMERON) tablet 15 mg  15 mg Oral QHS Kieren Adkison A, MD   15 mg at 11/30/17 2115  . naproxen (NAPROSYN) tablet 500 mg  500 mg Oral BID PRN Nira ConnBerry, Jason A, NP   500 mg at 11/30/17 1123  . nicotine (NICODERM CQ - dosed in mg/24 hours) patch 21 mg  21 mg Transdermal Daily Leron Stoffers, Rockey SituFernando A, MD   21 mg at 12/01/17 0817  . ondansetron (ZOFRAN-ODT) disintegrating tablet 4 mg  4 mg Oral Q6H PRN Jackelyn PolingBerry, Jason A, NP   4 mg at 11/28/17 2139    Lab Results: No results found for this or any previous visit (from the past 48 hour(s)).  Blood Alcohol level:  Lab Results  Component Value Date   ETH <10 11/26/2017   [facility] DtETH  09/15/2009    <5        LOWEST DETECTABLE LIMIT FOR SERUM ALCOHOL IS 5 mg/dL FOR MEDICAL PURPOSES ONLY    Metabolic Disorder Labs: No results found for: HGBA1C, MPG No results found for: PROLACTIN No results found for: CHOL, TRIG, HDL, CHOLHDL, VLDL, LDLCALC  Physical Findings: AIMS: Facial and Oral Movements Muscles of Facial Expression: None, normal Lips and Perioral Area: None, normal Jaw: None, normal Tongue: None, normal,Extremity Movements Upper (arms, wrists, hands, fingers): None, normal Lower (legs, knees, ankles, toes):  None, normal, Trunk Movements Neck, shoulders, hips: None, normal, Overall Severity Severity of abnormal movements (highest score from questions above): None, normal Incapacitation due to abnormal movements: None, normal Patient's awareness of abnormal movements (rate only patient's report): No Awareness, Dental Status Current problems with teeth and/or dentures?: No Does patient usually wear dentures?: No  CIWA:    COWS:  COWS Total Score: 3  Musculoskeletal: Strength & Muscle Tone: within normal  limits Gait & Station: normal Patient leans: N/A  Psychiatric Specialty Exam: Physical Exam  Nursing note and vitals reviewed. Constitutional: She is oriented to person, place, and time. She appears well-developed and well-nourished.  Respiratory: Effort normal.  Musculoskeletal: Normal range of motion.  Neurological: She is alert and oriented to person, place, and time.  Skin: Skin is warm.    Review of Systems  Constitutional: Negative.   HENT: Negative.   Eyes: Negative.   Respiratory: Negative.   Cardiovascular: Negative.   Gastrointestinal: Negative.   Genitourinary: Negative.   Musculoskeletal: Negative.   Skin: Negative.   Neurological: Negative.   Endo/Heme/Allergies: Negative.   Psychiatric/Behavioral: The patient is nervous/anxious.     Blood pressure 104/76, pulse (!) 101, temperature 98.2 F (36.8 C), temperature source Oral, resp. rate 16, height 5\' 3"  (1.6 m), weight 66.7 kg (147 lb), last menstrual period 11/19/2017.Body mass index is 26.04 kg/m.  General Appearance: Casual  Eye Contact:  Good  Speech:  Clear and Coherent and Normal Rate  Volume:  Normal  Mood:  Anxious  Affect:  Congruent  Thought Process:  Goal Directed and Descriptions of Associations: Intact  Orientation:  Full (Time, Place, and Person)  Thought Content:  WDL  Suicidal Thoughts:  No  Homicidal Thoughts:  No  Memory:  Immediate;   Good Recent;   Good Remote;   Good  Judgement:  Good   Insight:  Good  Psychomotor Activity:  Normal  Concentration:  Concentration: Good and Attention Span: Good  Recall:  Good  Fund of Knowledge:  Good  Language:  Good  Akathisia:  No  Handed:  Right  AIMS (if indicated):     Assets:  Communication Skills Desire for Improvement Financial Resources/Insurance Housing Physical Health Social Support Transportation  ADL's:  Intact  Cognition:  WNL  Sleep:  Number of Hours: 6.25   Problems Addressed: SUD MDD severe  Treatment Plan Summary: Daily contact with patient to assess and evaluate symptoms and progress in treatment, Medication management and Plan is to:  -Continue Abilify 5 mg PO Daily for mood stability -Continue Remeron 15 mg PO QHS for mood stability -Continue Clonidine Protocol -Continue Vistaril 50 mg PO Q6H PRN for anxiety -Encourage group therapy participation  Maryfrances Bunnell, FNP 12/01/2017, 12:38 PM   Agree with NP Progress Note

## 2017-12-01 NOTE — Progress Notes (Signed)
D: Pt denies SI/HI/AV hallucinations. Pt is pleasant and cooperative.  A: Pt was offered support and encouragement. Pt was given scheduled medications. Pt was encourage to attend groups. Q 15 minute checks were done for safety.  R:Pt did not attend group. Pt is taking medication. Pt has no complaints.Pt receptive to treatment and safety maintained on unit. 

## 2017-12-01 NOTE — [facility] Group Notes (Signed)
LCSW Group Therapy Note  12/01/2017 1:15pm  Type of Therapy and Topic:  Group Therapy: Avoiding Self-Sabotaging and Enabling Behaviors  Participation Level:  Active   Description of Group:   In this group, patients will learn how to identify obstacles, self-sabotaging and enabling behaviors, as well as: what are they, why do we do them and what needs these behaviors meet. Discuss unhealthy relationships and how to have positive healthy boundaries with those that sabotage and enable. Explore aspects of self-sabotage and enabling in yourself and how to limit these self-destructive behaviors in everyday life.   Therapeutic Goals: 1. Patient will identify one obstacle that relates to self-sabotage and enabling behaviors 2. Patient will identify one personal self-sabotaging or enabling behavior they did prior to admission 3. Patient will state a plan to change the above identified behavior 4. Patient will demonstrate ability to communicate their needs through discussion and/or role play.   Summary of Patient Progress:  Patient was attentive and engaged during today's processing group. She identified that "self pity, procrastination, and hanging onto negative relationships are three ways that self sabotage has affected me." Selena BattenKim shared some stories about her experiences in sabotaging her relationships and her sobriety. She continues to make progress in the group setting with improving insight.    Therapeutic Modalities:   Cognitive Behavioral Therapy Person-Centered Therapy Motivational Interviewing   Pulte HomesHeather N Smart, LCSW 12/01/2017 3:28 PM

## 2017-12-01 NOTE — Progress Notes (Signed)
Psychoeducational Group Note  Date:  12/01/2017 Time: 2209  Group Topic/Focus:  Wrap-Up Group:   The focus of this group is to help patients review their daily goal of treatment and discuss progress on daily workbooks.  Participation Level: Did Not Attend  Participation Quality:  Not Applicable  Affect:  Not Applicable  Cognitive:  Not Applicable  Insight:  Not Applicable  Engagement in Group: Not Applicable  Additional Comments:  The patient did not attend group since she was asleep in her bedroom.   Deno Sida S 12/01/2017, 10:09 PM

## 2017-12-01 NOTE — Progress Notes (Signed)
D     Pt is pleasant and cooperative    She said she is to be discharged tomorrow but is not sure she will have a ride and was asking if the hospital helped with that     She expressed disapointment because she was not able to go to further rehab    She reports feeling much better and denies withdrawal symptoms  A    Verbal support given   Medications administered and effectiveness monitored    Q 15 min checks    Advised pt to talk with her case manager about any transportation issues  R    Pt is safe at present time  And receptive to verbal support

## 2017-12-01 NOTE — Progress Notes (Signed)
D: Patient observed up and visible in the milieu. Restless, anxious. States to this Clinical research associatewriter she is leaving tomorrow and states, "I was hoping to stay longer but I know I have to face life." Patient's affect anxious with congruent mood.  Denies pain, physical complaints.   A: Medicated per orders, prn vistaril given for anxiety x 3 doses today. Level III obs in place for safety. Emotional support offered and self inventory reviewed. Encouraged completion of Suicide Safety Plan and programming participation. Discussed POC with MD, SW.    R: Patient verbalizes understanding of POC. Patient denies SI/HI/AVH and remains safe on level III obs. Will continue to monitor closely and make verbal contact frequently.

## 2017-12-01 NOTE — Progress Notes (Signed)
Recreation Therapy Notes  Date: 12/01/17 Time: 0930 Location: [address] Dayroom  Group Topic: Stress Management  Goal Area(s) Addresses:  Patient will verbalize importance of using healthy stress management.  Patient will identify positive emotions associated with healthy stress management.   Intervention: Stress Management  Activity : Guided Imagery.  LRT introduced the stress management technique of guided imagery.  LRT read Ball script that allowed patients to visualize themselves along the beach.  Patients were to listen and follow along as script was read to engage in the activity.  Education: Stress Management, Discharge Planning.   Education Outcome: Acknowledges edcuation/In group clarification offered/Needs additional education  Clinical Observations/Feedback: Pt did not attend group.   Majd Tissue, LRT/CTRS         Alexandra Ball 12/01/2017 11:55 AM 

## 2017-12-02 MED ORDER — ARIPIPRAZOLE 5 MG PO TABS: 5.0000 mg | ORAL_TABLET | Freq: Every day | ORAL | 0 refills | Status: DC

## 2017-12-02 MED ORDER — HYDROXYZINE HCL 50 MG PO TABS: 50.0000 mg | ORAL_TABLET | Freq: Four times a day (QID) | ORAL | 0 refills | Status: DC | PRN

## 2017-12-02 MED ORDER — MIRTAZAPINE 15 MG PO TABS: 15.0000 mg | ORAL_TABLET | Freq: Every day | ORAL | 0 refills | Status: DC

## 2017-12-02 NOTE — Progress Notes (Signed)
[facility] Group Notes:  (Nursing/MHT/Case Management/Adjunct)  Date:  12/02/2017  Time:  0830  Type of Therapy:  Nurse Education  Participation Level:  Active  Participation Quality:  Appropriate  Affect:  Appropriate  Cognitive:  Alert and Oriented  Insight:  Appropriate  Engagement in Group:  Engaged  Modes of Intervention:  Activity, Discussion and Education  Summary of Progress/Problems:The purpose of this group is to introduce and educate patients to the benefits of aromatherapy. This patient participated and was engaged in group.   Alexandra Ball, Alexandra Ball 12/02/2017, 9:53 AM

## 2017-12-02 NOTE — [facility] Suicide Risk Assessment (Signed)
[facility] INPATIENT:  Family/Significant Other Suicide Prevention Education  Suicide Prevention Education:  Patient Refusal for Family/Significant Other Suicide Prevention Education: The patient Alexandra Ball has refused to provide written consent for family/significant other to be provided Family/Significant Other Suicide Prevention Education during admission and/or prior to discharge.  Physician notified.  SPE completed with pt, as pt refused to consent to family contact. SPI pamphlet provided to pt and pt was encouraged to share information with support network, ask questions, and talk about any concerns relating to SPE. Pt denies access to guns/firearms and verbalized understanding of information provided. Mobile Crisis information also provided to pt.   Javonn Gauger N Smart LCSW 12/02/2017, 9:24 AM

## 2017-12-02 NOTE — Progress Notes (Signed)
Discharge note:  Patient discharged home per MD order.  Reviewed AVS/transition record with patient and she indicated understanding.  She denies any thoughts of self harm.  [facility].  She received all belongings from unit and locker.  Patient left ambulatory with [facility] to [facility].  Her family will pick her up from there.

## 2017-12-02 NOTE — Progress Notes (Addendum)
  [facility] :  Will you be returning to the same living situation after discharge:  Yes,  home with inlaws until she goes to court and is eligible for treatment.  At discharge, do you have transportation home?: Yes,  [facility]. Do you have the ability to pay for your medications: Yes,  [place] [employer]  Release of information consent forms completed and submitted to medical records by CSW.  Patient to Follow up at: Follow-up Information    [facility] Follow up.   Specialty:  Addiction Medicine Why:  Referral made 11/30/17. Please follow-up with Shayla in admissions daily to check status of referral and waitlist. You will have to complete court obligations before being eligible [facility].  Contact information: [address] [place] [address] [phone]        [facility] Follow up on 12/06/2017.   Why:  Hospital follow-up on 8:00AM on Monday 12/06/17. Please bring the following if you have them: photo ID, social security card, any proof of income, and hospital discharge paperwork. Thank you.  Contact information: [address] [place] [id number] [phone]        [facility] Follow [facility].   Specialty:  Internal Medicine Why:  Please call office after hospital/rehab discharge to schedule first appt with primary care physician. Thank you.  Contact information: [address] [place] [id number] [phone]          Next level of care provider has access to [facility] Link:no  Safety Planning and Suicide Prevention discussed: Yes,  SPE completed with pt; pt declined to consent to family contact. SPI pamphlet and Mobile Crisis information provided to pt.   Have you used any form of tobacco in the last 30 days? (Cigarettes, Smokeless Tobacco, Cigars, and/or Pipes): Yes  Has patient been referred to the Quitline?: Patient  refused referral  Patient has been referred for addiction treatment: Yes  Pulte HomesHeather N Smart, LCSW 12/02/2017, 2:53 PM

## 2017-12-02 NOTE — Plan of Care (Signed)
  Completed/Met Education: Knowledge of disease or condition will improve 12/02/2017 0941 - Completed/Met by Joice Lofts, RN Understanding of discharge needs will improve 12/02/2017 0941 - Completed/Met by Joice Lofts, RN Health Behavior/Discharge Planning: Ability to identify changes in lifestyle to reduce recurrence of condition will improve 12/02/2017 0941 - Completed/Met by Joice Lofts, RN Identification of resources available to assist in meeting health care needs will improve 12/02/2017 0941 - Completed/Met by Joice Lofts, RN Physical Regulation: Complications related to the disease process, condition or treatment will be avoided or minimized 12/02/2017 0941 - Completed/Met by Joice Lofts, RN Education: Ability to make informed decisions regarding treatment will improve 12/02/2017 0941 - Completed/Met by Joice Lofts, RN Self-Concept: Ability to disclose and discuss suicidal ideas will improve 12/02/2017 0941 - Completed/Met by Joice Lofts, RN Ability to verbalize positive feelings about self will improve 12/02/2017 0941 - Completed/Met by Joice Lofts, RN Activity: Interest or engagement in activities will improve 12/02/2017 0941 - Completed/Met by Joice Lofts, RN Sleeping patterns will improve 12/02/2017 0941 - Completed/Met by Joice Lofts, RN Education: Knowledge of [place] Education information/materials will improve 12/02/2017 0941 - Completed/Met by Joice Lofts, RN Emotional status will improve 12/02/2017 0941 - Completed/Met by Joice Lofts, RN Mental status will improve 12/02/2017 0941 - Completed/Met by Joice Lofts, RN Verbalization of understanding the information provided will improve 12/02/2017 0941 - Completed/Met by Joice Lofts, RN Coping: Ability to verbalize frustrations and anger appropriately will improve 12/02/2017 0941 - Completed/Met by Joice Lofts, RN Ability to demonstrate self-control will improve 12/02/2017 0941 - Completed/Met by Joice Lofts, [place] Behavior/Discharge Planning: Identification of resources available to assist in meeting health care needs will improve 12/02/2017 0941 - Completed/Met by Joice Lofts, RN Compliance with treatment plan for underlying cause of condition will improve 12/02/2017 0941 - Completed/Met by Joice Lofts, RN Physical Regulation: Ability to maintain clinical measurements within normal limits will improve 12/02/2017 0941 - Completed/Met by Joice Lofts, RN Safety: Periods of time without injury will increase 12/02/2017 0941 - Completed/Met by Joice Lofts, RN Spiritual Needs Ability to function at adequate level 12/02/2017 0941 - Completed/Met by Joice Lofts, RN

## 2017-12-02 NOTE — [facility] Suicide Risk Assessment (Signed)
[facility] Discharge Suicide Risk Assessment   Principal Problem: Severe recurrent major depression without psychotic features [facility]) Discharge Diagnoses:  Patient Active Problem List   Diagnosis Date Noted  . Severe recurrent major depression without psychotic features (HCC) [F33.2] 11/27/2017  . Substance use disorder [F19.90] 11/27/2017  . S/P tubal ligation [Z98.51] 07/02/2011  . Postpartum care following vaginal delivery [Z39.2] 06/30/2011    Total Time spent with patient: 30 minutes  Musculoskeletal: Strength & Muscle Tone: within normal limits Gait & Station: normal Patient leans: N/A  Psychiatric Specialty Exam: ROS no headache, no chest pain, no shortness of breath, no vomiting    Blood pressure (!) 126/96, pulse 99, temperature 97.8 F (36.6 C), temperature source Oral, resp. rate 18, height 5\' 3"  (1.6 m), weight 66.7 kg (147 lb), last menstrual period 11/19/2017.Body mass index is 26.04 kg/m.  General Appearance: Well Groomed  Eye Contact::  Good  Speech:  Normal Rate409  Volume:  Normal  Mood:  reports her mood is  " a lot better "  Affect:  Appropriate and less anxious   Thought Process:  Linear and Descriptions of Associations: Intact  Orientation:  Other:  fully alert and attentive   Thought Content:  no hallucinations, no delusions, not internally preoccupied   Suicidal Thoughts:  No denies suicidal or self injurious ideations, denies any homicidal or violent ideations  Homicidal Thoughts:  No  Memory:  recent and remote grossly intact   Judgement:  Other:  improving   Insight:  improving   Psychomotor Activity:  Normal  Concentration:  Good  Recall:  Good  Fund of Knowledge:Good  Language: Good  Akathisia:  Negative  Handed:  Right  AIMS (if indicated):     Assets:  Communication Skills Desire for Improvement Resilience  Sleep:  Number of Hours: 4  Cognition: WNL  ADL's:  Intact   Mental Status Per Nursing Assessment::   On Admission:  Self-harm  thoughts  Demographic Factors:  37 year old married female, has three children, currently with their grandmother  Loss Factors: Not having custody of children, substance abuse, legal issues   Historical Factors: Prior psychiatric admissions , which she states have been related to substance abuse. Denies history of suicidal attempts. History of Opiate Dependence  Risk Reduction Factors:   Responsible for children under 75 years of age, Sense of responsibility to family and Positive coping skills or problem solving skills  Continued Clinical Symptoms:  At this time patient is alert, attentive, well related, pleasant, mood is improved, affect is reactive, mildly anxious, no thought disorder, no suicidal or self injurious ideations, no homicidal or violent ideations, future oriented. Reports symptoms of WDL have abated, resolved, and is currently comfortable and in no acute distress. Denies medication side effects.    Cognitive Features That Contribute To Risk:  No gross cognitive deficits noted upon discharge. Is alert , attentive, and oriented x 3    Suicide Risk:  Mild:  Suicidal ideation of limited frequency, intensity, duration, and specificity.  There are no identifiable plans, no associated intent, mild dysphoria and related symptoms, good self-control (both objective and subjective assessment), few other risk factors, and identifiable protective factors, including available and accessible social support.  Follow-up Information    [facility] Follow up.   Specialty:  Addiction Medicine Why:  Referral made 11/30/17. Please follow-up with Shayla in admissions daily to check status of referral and waitlist. You will have to complete court obligations before being eligible for treatment  [facility].  Contact information: [address] [place] [address] [phone]        [facility] Follow up on 12/03/2017.   Why:  Hospital  follow-up on 9:00AM on Friday 12/03/17. Please bring the following if you have them: photo ID, social security card, any proof of income, and hospital discharge paperwork. Thank you.  Contact information: [address] [place] [id number] [phone]           Plan Of Care/Follow-up recommendations:  Activity:  as tolerated  Diet:  regular Tests:  NA Other:  See below  Patient is expressing readiness for discharge, plans to return home. States that after taking care of an upcoming court date, plans to seek admission to a rehab or long term residential program.  Plans to follow up as above. States she plans to get a PCP for medical issues as needed and to monitor, manage Hep C.   Craige CottaFernando A Cobos, MD 12/02/2017, 1:39 PM
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
## 2018-01-12 ENCOUNTER — Encounter: Payer: Self-pay | Admitting: Internal Medicine

## 2018-03-06 IMAGING — CT CT RENAL STONE PROTOCOL
2 of 4 series · 16 of 46 positions shown, 18 images · non-contrast
Comparison: 02/28/2016

CLINICAL DATA: 34-year-old female with a history of left flank pain
for 2 days and hematuria

EXAM:
CT ABDOMEN AND PELVIS WITHOUT CONTRAST
TECHNIQUE: Multidetector CT imaging of the abdomen and pelvis was performed
following the standard protocol without IV contrast.

[Series 2: routine abd pel with · axial · 0.68mm/px · z∈[-434,-9]mm · 13 of 93 slices shown, 15 images]
[im 4/93  soft-tissue]
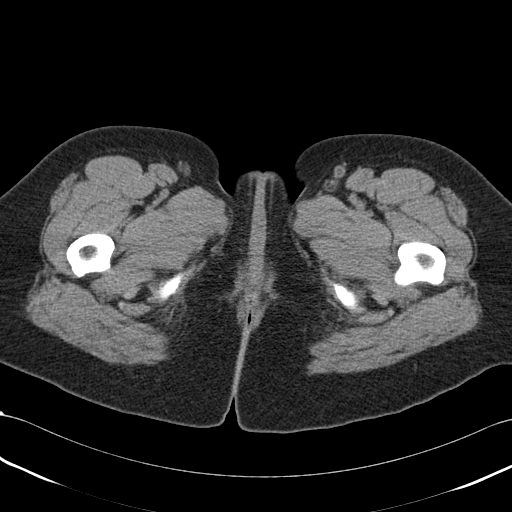
[im 4/93  bone]
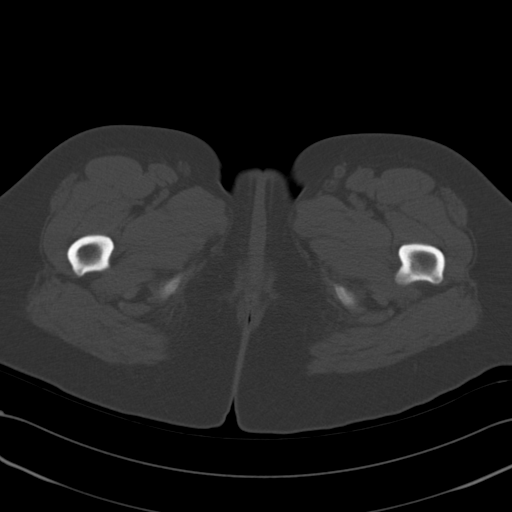
[im 12/93  soft-tissue]
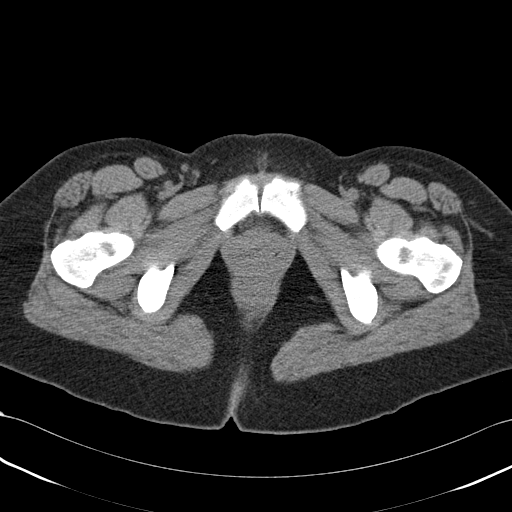
[im 20/93  soft-tissue]
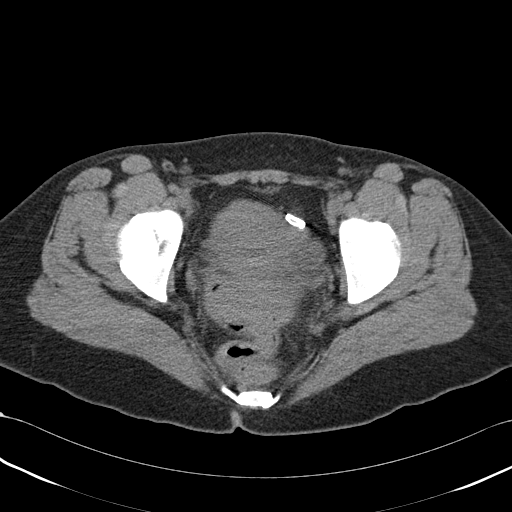
[im 27/93  soft-tissue]
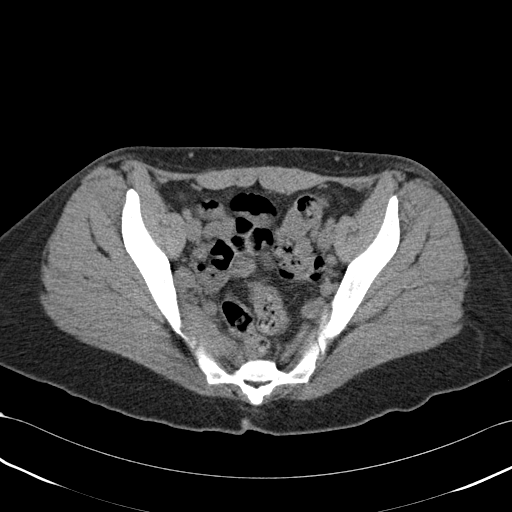
[im 31/93  soft-tissue]
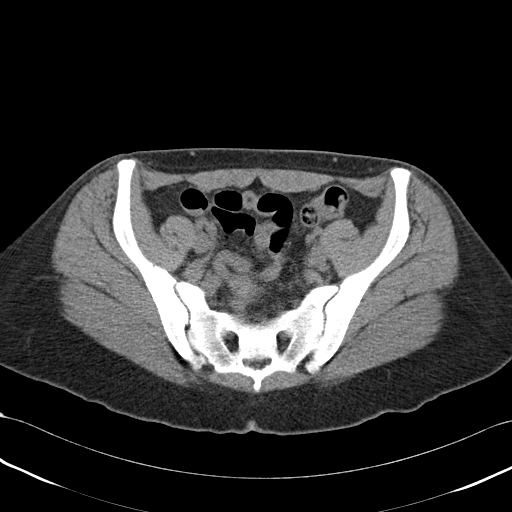
[im 39/93  soft-tissue]
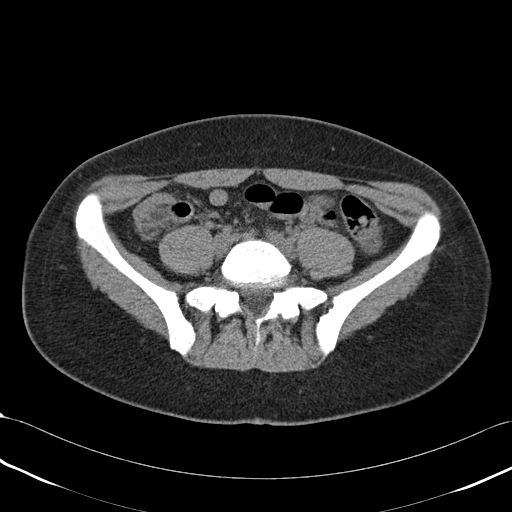
[im 47/93  soft-tissue]
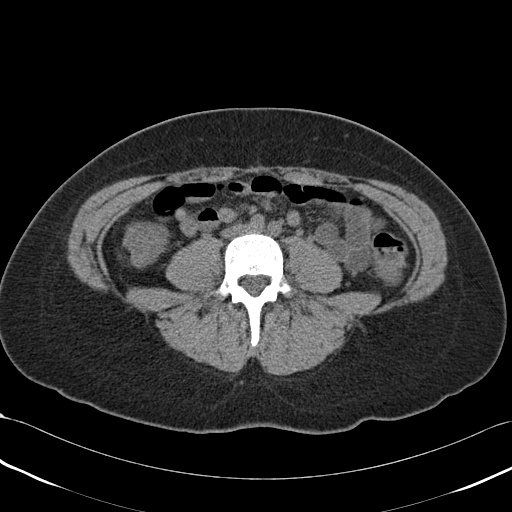
[im 54/93  soft-tissue]
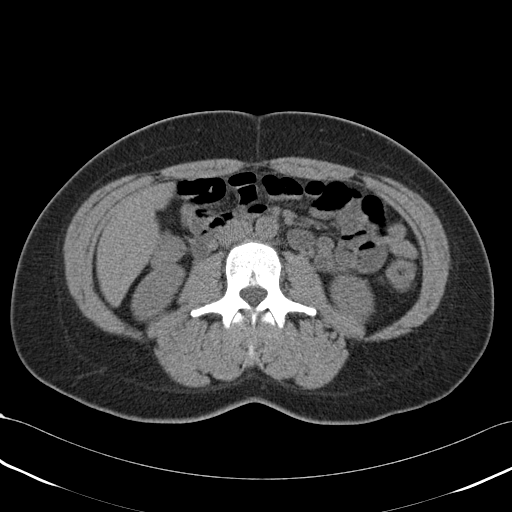
[im 62/93  soft-tissue]
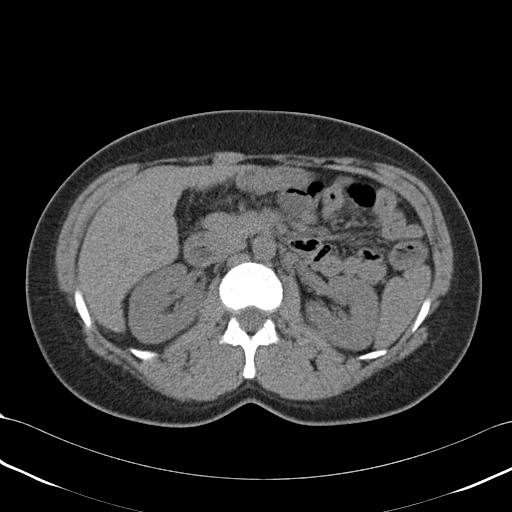
[im 62/93  bone]
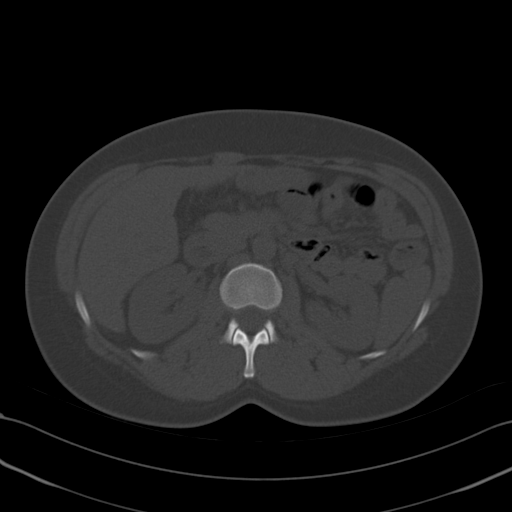
[im 66/93  soft-tissue]
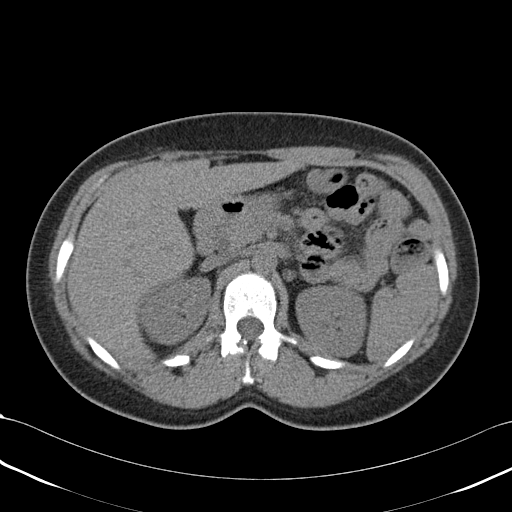
[im 73/93  soft-tissue]
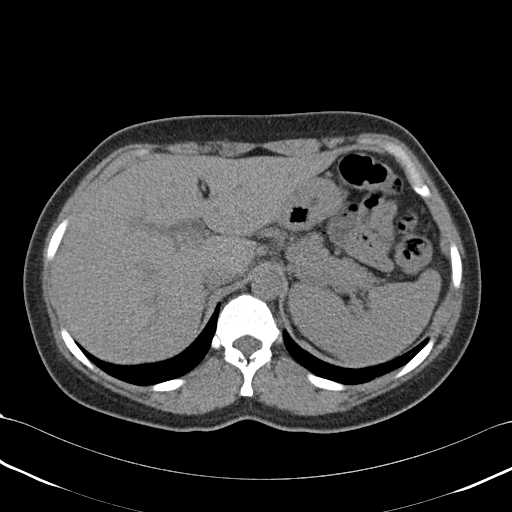
[im 81/93  soft-tissue]
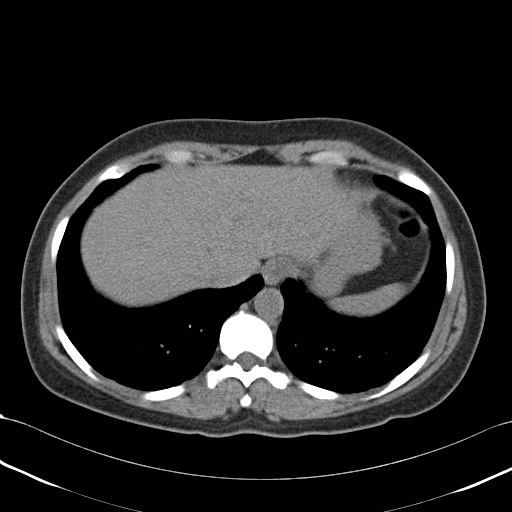
[im 89/93  soft-tissue]
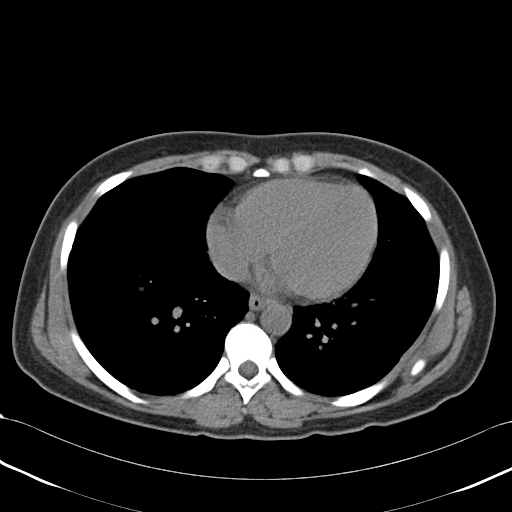

[Series 3: coronal · coronal · 0.62mm/px · 3 of 147 slices shown]
[im 49/147  soft-tissue]
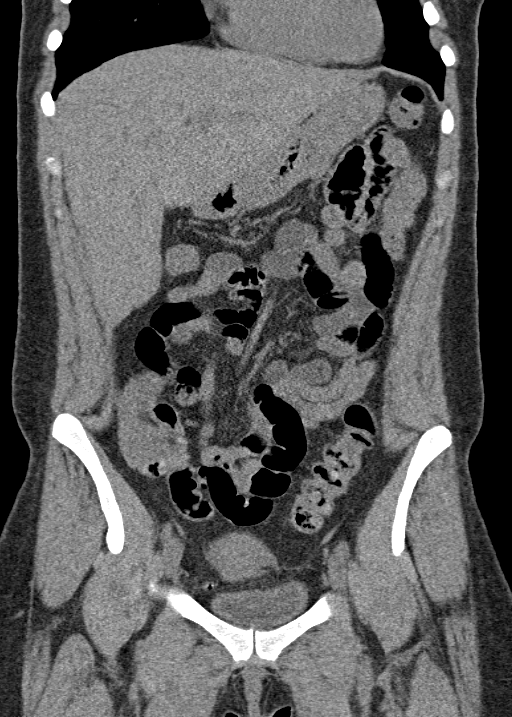
[im 65/147  soft-tissue]
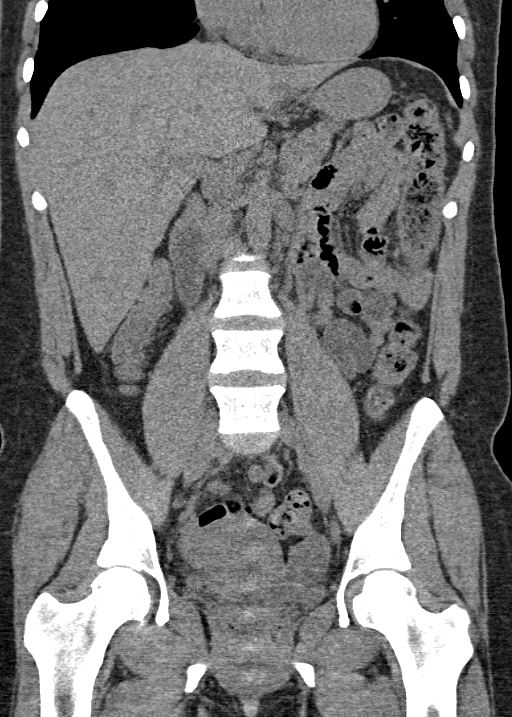
[im 82/147  soft-tissue]
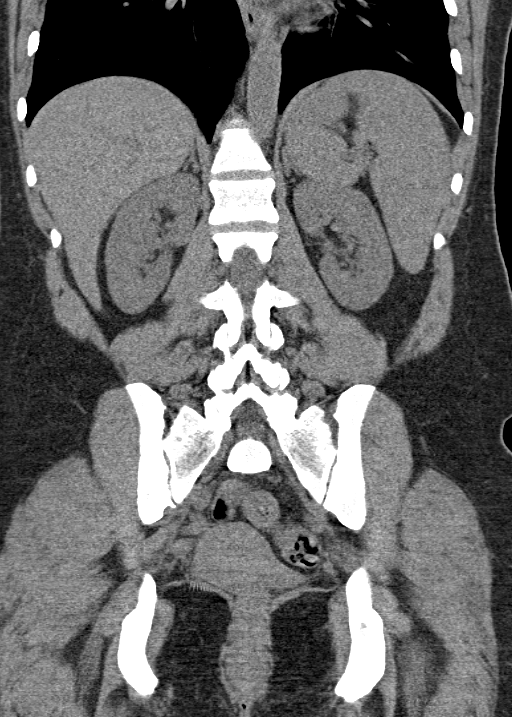

[16 of 46 positions shown; findings below may reference images not displayed]

FINDINGS: Lower chest:

Unremarkable appearance of the soft tissues of the chest wall.

Heart size within normal limits.  No pericardial fluid/thickening.

No lower mediastinal adenopathy.

Unremarkable appearance of the distal esophagus.

No hiatal hernia.

No confluent airspace disease, pleural fluid, or pneumothorax within
visualized lung.

Abdomen/pelvis:

Unremarkable appearance of liver and spleen.

Unremarkable appearance of bilateral adrenal glands.

Cholecystectomy.

Unremarkable pancreas.

No intrahepatic or extrahepatic biliary ductal dilatation.

No intra-peritoneal free air or significant free-fluid.

No abnormally dilated small bowel or colon. No transition point. No
inflammatory changes of the mesenteries.

Normal appendix identified.

No diverticular disease.

Right Kidney/Ureter:

No hydronephrosis. No nephrolithiasis. No perinephric stranding.
Unremarkable course of the right ureter.

Left Kidney/Ureter:

No hydronephrosis. No nephrolithiasis. No perinephric stranding.

Unremarkable course of the left ureter.

Unremarkable appearance of the urinary bladder.

Bilateral tubal ligation.

Unremarkable appearance of the uterus.

Physiologic changes of the adnexa.

No significant vascular calcification. No aneurysm or periaortic
fluid identified.

Musculoskeletal:

No displaced fracture identified.

No significant degenerative changes of the spine.
IMPRESSION: No acute finding.  Specifically, no evidence of nephrolithiasis.

Physiologic changes of the left adnexa.

## 2018-03-07 ENCOUNTER — Ambulatory Visit: Payer: Self-pay | Admitting: Gastroenterology

## 2018-03-07 ENCOUNTER — Encounter: Payer: Self-pay | Admitting: Internal Medicine

## 2018-03-07 ENCOUNTER — Telehealth: Payer: Self-pay | Admitting: Gastroenterology

## 2018-03-07 NOTE — Telephone Encounter (Signed)
PATIENT WAS A NO SHOW AND LETTER SENT  °

## 2018-12-14 ENCOUNTER — Emergency Department (HOSPITAL_COMMUNITY)
Admission: EM | Admit: 2018-12-14 | Discharge: 2018-12-15 | Disposition: A | Discharge status: Rehabilitation Facility | Payer: Medicaid Other | Attending: Emergency Medicine | Admitting: Emergency Medicine

## 2018-12-14 ENCOUNTER — Other Ambulatory Visit: Payer: Self-pay

## 2018-12-14 ENCOUNTER — Encounter (HOSPITAL_COMMUNITY): Payer: Self-pay | Admitting: *Deleted

## 2018-12-14 DIAGNOSIS — F1120 Opioid dependence, uncomplicated: Secondary | ICD-10-CM | POA: Diagnosis not present

## 2018-12-14 DIAGNOSIS — F1510 Other stimulant abuse, uncomplicated: Secondary | ICD-10-CM | POA: Diagnosis not present

## 2018-12-14 DIAGNOSIS — F1910 Other psychoactive substance abuse, uncomplicated: Secondary | ICD-10-CM

## 2018-12-14 DIAGNOSIS — R109 Unspecified abdominal pain: Secondary | ICD-10-CM | POA: Diagnosis not present

## 2018-12-14 DIAGNOSIS — F332 Major depressive disorder, recurrent severe without psychotic features: Secondary | ICD-10-CM | POA: Insufficient documentation

## 2018-12-14 DIAGNOSIS — E876 Hypokalemia: Secondary | ICD-10-CM | POA: Insufficient documentation

## 2018-12-14 DIAGNOSIS — Z590 Homelessness: Secondary | ICD-10-CM | POA: Diagnosis not present

## 2018-12-14 DIAGNOSIS — Z008 Encounter for other general examination: Secondary | ICD-10-CM | POA: Diagnosis present

## 2018-12-14 HISTORY — DX: Major depressive disorder, single episode, unspecified: F32.9

## 2018-12-14 HISTORY — DX: Depression, unspecified: F32.A

## 2018-12-14 MED ORDER — POTASSIUM CHLORIDE 10 MEQ/100ML IV SOLN
10.0000 meq | Freq: Once | INTRAVENOUS | Status: DC
  Filled 2018-12-14: qty 100

## 2018-12-14 MED ORDER — CLONIDINE HCL 0.1 MG PO TABS
0.1000 mg | ORAL_TABLET | Freq: Once | ORAL | Status: AC
  Administered 2018-12-15: 0.1 mg via ORAL
  Filled 2018-12-14: qty 1

## 2018-12-14 MED ORDER — HYDROXYZINE HCL 50 MG/ML IM SOLN
50.0000 mg | Freq: Four times a day (QID) | INTRAMUSCULAR | Status: DC | PRN
  Administered 2018-12-15 (×2): 50 mg via INTRAMUSCULAR
  Filled 2018-12-14 (×2): qty 1

## 2018-12-14 MED ORDER — POTASSIUM CHLORIDE CRYS ER 20 MEQ PO TBCR
40.0000 meq | EXTENDED_RELEASE_TABLET | Freq: Once | ORAL | Status: DC
  Filled 2018-12-14: qty 2

## 2018-12-14 MED ORDER — ONDANSETRON HCL 4 MG/2ML IJ SOLN
4.0000 mg | Freq: Once | INTRAMUSCULAR | Status: DC
  Filled 2018-12-14: qty 2

## 2018-12-14 MED ORDER — SODIUM CHLORIDE 0.9 % IV BOLUS: 1000.0000 mL | Freq: Once | INTRAVENOUS | Status: DC

## 2018-12-14 NOTE — ED Notes (Signed)
Call from registration stating pt came to desk stating "I'm starting to have hallucinations and I don't want to freak people out", pt placed in hallway bed at this time

## 2018-12-14 NOTE — ED Notes (Signed)
Pt reports recently becoming homeless due to eviction and reports Hx of seizures related to previous withdrawls

## 2018-12-14 NOTE — ED Provider Notes (Signed)
[facility] EMERGENCY DEPARTMENT Provider Note   CSN: [id number] Arrival date & time: 12/14/18  1829     History   Chief Complaint Chief Complaint  Patient presents with  . Drug Problem    HPI Alexandra Ball is a 38 y.o. female.  Patient uses heroin and methamphetamines and she want detox.  She has been feeling nauseated and anxious  The history is provided by the patient. No language interpreter was used.  Drug Problem  This is a recurrent problem. The current episode started more than 1 week ago. The problem occurs constantly. The problem has not changed since onset.Associated symptoms include abdominal pain. Pertinent negatives include no chest pain and no headaches. Nothing aggravates the symptoms. Nothing relieves the symptoms. She has tried nothing for the symptoms. The treatment provided no relief.    Past Medical History:  Diagnosis Date  . Bipolar disorder (HCC)   . Carpal tunnel syndrome   . Depression   . Noncompliance with medication regimen   . Seizures (HCC)    mild one 34months ago, off meds for pregnancy. Was on lamictil and dilatin. Last seizure 1 year ago as of 12/14/18  . Substance abuse (HCC)    Multiple substances    Patient Active Problem List   Diagnosis Date Noted  . Severe recurrent major depression without psychotic features (HCC) 11/27/2017  . Substance use disorder 11/27/2017  . S/P tubal ligation 07/02/2011  . Postpartum care following vaginal delivery 06/30/2011    Past Surgical History:  Procedure Laterality Date  . CHOLECYSTECTOMY OPEN  2006   during second pregnancy  . OVARIAN CYST REMOVAL    . TUBAL LIGATION  07/01/2011   Procedure: POST PARTUM TUBAL LIGATION;  Surgeon: Scheryl Darter, MD;  Location: [facility];  Service: Gynecology;  Laterality: Bilateral;  With Filshie Clips      OB History    Gravida  6   Para  3   Term  3   Preterm  0   AB  2   Living  3     SAB  2   TAB  0   Ectopic  0   Multiple  0   Live  Births  3            Home Medications    Prior to Admission medications   Not on File    Family History No family history on file.  Social History Social History   Tobacco Use  . Smoking status: Current Every Day Smoker    Packs/day: 1.00    Years: 10.00    Pack years: 10.00    Types: Cigarettes  . Smokeless tobacco: Never Used  Substance Use Topics  . Alcohol use: No    Comment: previos hx of drug abuse admitted to behavioral health in 2010; states occasionally on 03/06/12  . Drug use: Yes    Types: Other-see comments, Marijuana    Comment: heroin, meth, marijuana     Allergies   Ultram [tramadol hcl]   Review of Systems Review of Systems  Constitutional: Negative for appetite change and fatigue.  HENT: Negative for congestion, ear discharge and sinus pressure.   Eyes: Negative for discharge.  Respiratory: Negative for cough.   Cardiovascular: Negative for chest pain.  Gastrointestinal: Positive for abdominal pain. Negative for diarrhea.  Genitourinary: Negative for frequency and hematuria.  Musculoskeletal: Negative for back pain.  Skin: Negative for rash.  Neurological: Negative for seizures and headaches.  Psychiatric/Behavioral: Negative for hallucinations.  Depression anxiety     Physical Exam Updated Vital Signs BP (!) 134/102 (BP Location: Right Arm)   Pulse (!) 110   Temp 97.6 F (36.4 C) (Oral)   Resp 20   Ht 5\' 2"  (1.575 m)   Wt 77.1 kg   LMP 11/30/2018   SpO2 100%   BMI 31.09 kg/m   Physical Exam Vitals signs and nursing note reviewed.  Constitutional:      Appearance: She is well-developed.  HENT:     Head: Normocephalic.     Nose: Nose normal.  Eyes:     General: No scleral icterus.    Conjunctiva/sclera: Conjunctivae normal.  Neck:     Musculoskeletal: Neck supple.     Thyroid: No thyromegaly.  Cardiovascular:     Rate and Rhythm: Normal rate and regular rhythm.     Heart sounds: No murmur. No friction rub. No  gallop.   Pulmonary:     Breath sounds: No stridor. No wheezing or rales.  Chest:     Chest wall: No tenderness.  Abdominal:     General: There is no distension.     Tenderness: There is no abdominal tenderness. There is no rebound.  Musculoskeletal: Normal range of motion.  Lymphadenopathy:     Cervical: No cervical adenopathy.  Skin:    Findings: No erythema or rash.  Neurological:     Mental Status: She is oriented to person, place, and time.     Motor: No abnormal muscle tone.     Coordination: Coordination normal.  Psychiatric:     Comments: Depression      ED Treatments / Results  Labs (all labs ordered are listed, but only abnormal results are displayed) Labs Reviewed  COMPREHENSIVE METABOLIC PANEL - Abnormal; Notable for the following components:      Result Value   Potassium 2.7 (*)    Total Bilirubin 1.4 (*)    All other components within normal limits  CBC WITH DIFFERENTIAL/PLATELET  ETHANOL  RAPID URINE DRUG SCREEN, HOSP PERFORMED    EKG None  Radiology No results found.  Procedures Procedures (including critical care time)  Medications Ordered in ED Medications  cloNIDine (CATAPRES) tablet 0.1 mg (has no administration in time range)  sodium chloride 0.9 % bolus 1,000 mL (has no administration in time range)  ondansetron (ZOFRAN) injection 4 mg (has no administration in time range)  hydrOXYzine (VISTARIL) injection 50 mg (has no administration in time range)  potassium chloride SA (K-DUR,KLOR-CON) CR tablet 40 mEq (has no administration in time range)  potassium chloride 10 mEq in 100 mL IVPB (has no administration in time range)  potassium chloride 10 mEq in 100 mL IVPB (has no administration in time range)     Initial Impression / Assessment and Plan / ED Course  I have reviewed the triage vital signs and the nursing notes.  Pertinent labs & imaging results that were available during my care of the patient were reviewed by me and considered  in my medical decision making (see chart for details).     Patient with hypokalemia.  Also depression and substance abuse.  We will get behavioral health consult.  Patient given Zofran clonidine and Vistaril.  Behavior health consult for substance abuse  Final Clinical Impressions(s) / ED Diagnoses   Final diagnoses:  None    ED Discharge Orders    None       Bethann Berkshire, MD 12/15/18 [phone]

## 2018-12-14 NOTE — ED Triage Notes (Signed)
Pt states, "I want to get some help with heroin and meth addiction". Pt reports she last used heroin a few days ago and meth today.

## 2018-12-15 ENCOUNTER — Inpatient Hospital Stay (HOSPITAL_COMMUNITY)
Admission: AD | Admit: 2018-12-15 | Discharge: 2018-12-19 | DRG: 885 | Disposition: A | Discharge status: Home / Self Care | Payer: Medicaid Other | Source: Intra-hospital | Attending: Psychiatry | Admitting: Psychiatry

## 2018-12-15 ENCOUNTER — Other Ambulatory Visit: Payer: Self-pay

## 2018-12-15 ENCOUNTER — Encounter (HOSPITAL_COMMUNITY): Payer: Self-pay

## 2018-12-15 DIAGNOSIS — Z590 Homelessness: Secondary | ICD-10-CM | POA: Diagnosis not present

## 2018-12-15 DIAGNOSIS — F332 Major depressive disorder, recurrent severe without psychotic features: Secondary | ICD-10-CM | POA: Diagnosis present

## 2018-12-15 DIAGNOSIS — F1220 Cannabis dependence, uncomplicated: Secondary | ICD-10-CM | POA: Diagnosis present

## 2018-12-15 DIAGNOSIS — F1520 Other stimulant dependence, uncomplicated: Secondary | ICD-10-CM | POA: Diagnosis present

## 2018-12-15 DIAGNOSIS — F1310 Sedative, hypnotic or anxiolytic abuse, uncomplicated: Secondary | ICD-10-CM | POA: Diagnosis not present

## 2018-12-15 DIAGNOSIS — Z653 Problems related to other legal circumstances: Secondary | ICD-10-CM

## 2018-12-15 DIAGNOSIS — F1210 Cannabis abuse, uncomplicated: Secondary | ICD-10-CM | POA: Diagnosis not present

## 2018-12-15 DIAGNOSIS — F1510 Other stimulant abuse, uncomplicated: Secondary | ICD-10-CM | POA: Diagnosis present

## 2018-12-15 DIAGNOSIS — F1123 Opioid dependence with withdrawal: Secondary | ICD-10-CM | POA: Diagnosis present

## 2018-12-15 DIAGNOSIS — Z888 Allergy status to other drugs, medicaments and biological substances status: Secondary | ICD-10-CM | POA: Diagnosis not present

## 2018-12-15 DIAGNOSIS — F1320 Sedative, hypnotic or anxiolytic dependence, uncomplicated: Secondary | ICD-10-CM | POA: Diagnosis present

## 2018-12-15 DIAGNOSIS — Z9049 Acquired absence of other specified parts of digestive tract: Secondary | ICD-10-CM

## 2018-12-15 DIAGNOSIS — G40909 Epilepsy, unspecified, not intractable, without status epilepticus: Secondary | ICD-10-CM | POA: Diagnosis present

## 2018-12-15 DIAGNOSIS — F1910 Other psychoactive substance abuse, uncomplicated: Secondary | ICD-10-CM

## 2018-12-15 DIAGNOSIS — Z9851 Tubal ligation status: Secondary | ICD-10-CM

## 2018-12-15 LAB — CBC WITH DIFFERENTIAL/PLATELET
ABS IMMATURE GRANULOCYTES: 0.02 10*3/uL (ref 0.00–0.07)
Abs Immature Granulocytes: 0.04 10*3/uL (ref 0.00–0.07)
Basophils Absolute: 0.1 10*3/uL (ref 0.0–0.1)
Basophils Relative: 1 %
EOS ABS: 0.2 10*3/uL (ref 0.0–0.5)
Eosinophils Relative: 3 %
HCT: 39.2 % (ref 36.0–46.0)
Hemoglobin: 13.5 g/dL (ref 12.0–15.0)
Immature Granulocytes: 0 %
Immature Granulocytes: 1 %
Lymphocytes Relative: 41 %
Lymphs Abs: 3.2 10*3/uL (ref 0.7–4.0)
MCH: 29.9 pg (ref 26.0–34.0)
MCHC: 34.4 g/dL (ref 30.0–36.0)
MCV: 86.9 fL (ref 80.0–100.0)
Monocytes Absolute: 0.7 10*3/uL (ref 0.1–1.0)
Monocytes Relative: 9 %
Neutro Abs: 3.6 10*3/uL (ref 1.7–7.7)
Neutrophils Relative %: 45 %
Platelets: 200 10*3/uL (ref 150–400)
RBC: 4.51 MIL/uL (ref 3.87–5.11)
RDW: 13.1 % (ref 11.5–15.5)
WBC: 8.6 10*3/uL (ref 4.0–10.5)
nRBC: 0 % (ref 0.0–0.2)

## 2018-12-15 LAB — COMPREHENSIVE METABOLIC PANEL
ALT: 14 U/L (ref 0–44)
AST: 18 U/L (ref 15–41)
Albumin: 4.4 g/dL (ref 3.5–5.0)
Alkaline Phosphatase: 77 U/L (ref 38–126)
Anion gap: 10 (ref 5–15)
Anion gap: 9 (ref 5–15)
BILIRUBIN TOTAL: 0.6 mg/dL (ref 0.3–1.2)
BUN: 15 mg/dL (ref 6–20)
CO2: 19 mmol/L — ABNORMAL LOW (ref 22–32)
Calcium: 9 mg/dL (ref 8.9–10.3)
Chloride: 107 mmol/L (ref 98–111)
Creatinine, Ser: 0.78 mg/dL (ref 0.44–1.00)
GFR calc Af Amer: >60 mL/min (ref >60)
GFR calc non Af Amer: >60 mL/min (ref >60)
Glucose, Bld: 89 mg/dL (ref 70–99)
Potassium: 2.9 mmol/L — ABNORMAL LOW (ref 3.5–5.1)
Sodium: 136 mmol/L (ref 135–145)
Total Protein: 7.3 g/dL (ref 6.5–8.1)

## 2018-12-15 LAB — RAPID URINE DRUG SCREEN, HOSP PERFORMED
AMPHETAMINES: POSITIVE — AB
Barbiturates: NOT DETECTED
Benzodiazepines: POSITIVE — AB
COCAINE: NOT DETECTED
OPIATES: NOT DETECTED
TETRAHYDROCANNABINOL: POSITIVE — AB

## 2018-12-15 LAB — ETHANOL: Alcohol, Ethyl (B): <10 mg/dL (ref <10)

## 2018-12-15 MED ORDER — ENSURE ENLIVE PO LIQD
237.0000 mL | Freq: Two times a day (BID) | ORAL | Status: DC
  Administered 2018-12-15 – 2018-12-17 (×2): 237 mL via ORAL

## 2018-12-15 MED ORDER — PHENYTOIN SODIUM EXTENDED 100 MG PO CAPS
300.0000 mg | ORAL_CAPSULE | Freq: Every day | ORAL | Status: DC
  Administered 2018-12-15 – 2018-12-18 (×4): 300 mg via ORAL
  Filled 2018-12-15 (×5): qty 3

## 2018-12-15 MED ORDER — CHLORDIAZEPOXIDE HCL 25 MG PO CAPS: 25.0000 mg | ORAL_CAPSULE | Freq: Every day | ORAL | Status: DC

## 2018-12-15 MED ORDER — CHLORDIAZEPOXIDE HCL 25 MG PO CAPS
25.0000 mg | ORAL_CAPSULE | Freq: Four times a day (QID) | ORAL | Status: AC
  Administered 2018-12-15 – 2018-12-17 (×6): 25 mg via ORAL
  Filled 2018-12-15 (×6): qty 1

## 2018-12-15 MED ORDER — NICOTINE 21 MG/24HR TD PT24
21.0000 mg | MEDICATED_PATCH | Freq: Every day | TRANSDERMAL | Status: DC
  Administered 2018-12-15 – 2018-12-19 (×5): 21 mg via TRANSDERMAL
  Filled 2018-12-15 (×7): qty 1

## 2018-12-15 MED ORDER — NICOTINE POLACRILEX 2 MG MT GUM
CHEWING_GUM | OROMUCOSAL | Status: AC
  Filled 2018-12-15: qty 1

## 2018-12-15 MED ORDER — LORAZEPAM 1 MG PO TABS
ORAL_TABLET | ORAL | Status: AC
  Filled 2018-12-15: qty 2

## 2018-12-15 MED ORDER — ALUM & MAG HYDROXIDE-SIMETH 200-200-20 MG/5ML PO SUSP: 30.0000 mL | ORAL | Status: DC | PRN

## 2018-12-15 MED ORDER — NICOTINE POLACRILEX 2 MG MT GUM
2.0000 mg | CHEWING_GUM | OROMUCOSAL | Status: DC | PRN
  Administered 2018-12-15: 2 mg via ORAL

## 2018-12-15 MED ORDER — LOPERAMIDE HCL 2 MG PO CAPS
2.0000 mg | ORAL_CAPSULE | ORAL | Status: AC | PRN
  Administered 2018-12-16 – 2018-12-17 (×2): 2 mg via ORAL
  Filled 2018-12-15 (×2): qty 1

## 2018-12-15 MED ORDER — HYDROXYZINE HCL 50 MG PO TABS
ORAL_TABLET | ORAL | Status: AC
  Filled 2018-12-15: qty 1

## 2018-12-15 MED ORDER — ACETAMINOPHEN 325 MG PO TABS
650.0000 mg | ORAL_TABLET | Freq: Four times a day (QID) | ORAL | Status: DC | PRN
  Administered 2018-12-15 – 2018-12-17 (×5): 650 mg via ORAL
  Filled 2018-12-15 (×5): qty 2

## 2018-12-15 MED ORDER — CHLORDIAZEPOXIDE HCL 25 MG PO CAPS
25.0000 mg | ORAL_CAPSULE | Freq: Three times a day (TID) | ORAL | Status: AC
  Administered 2018-12-17 – 2018-12-18 (×3): 25 mg via ORAL
  Filled 2018-12-15 (×3): qty 1

## 2018-12-15 MED ORDER — VITAMIN B-1 100 MG PO TABS
100.0000 mg | ORAL_TABLET | Freq: Every day | ORAL | Status: DC
  Administered 2018-12-16 – 2018-12-18 (×3): 100 mg via ORAL
  Filled 2018-12-15 (×5): qty 1

## 2018-12-15 MED ORDER — POTASSIUM CHLORIDE CRYS ER 20 MEQ PO TBCR
40.0000 meq | EXTENDED_RELEASE_TABLET | ORAL | Status: AC
  Administered 2018-12-15 (×2): 40 meq via ORAL
  Filled 2018-12-15 (×2): qty 2

## 2018-12-15 MED ORDER — MIRTAZAPINE 30 MG PO TBDP
30.0000 mg | ORAL_TABLET | Freq: Every day | ORAL | Status: DC
  Administered 2018-12-15 – 2018-12-18 (×4): 30 mg via ORAL
  Filled 2018-12-15 (×5): qty 1

## 2018-12-15 MED ORDER — CHLORDIAZEPOXIDE HCL 25 MG PO CAPS
25.0000 mg | ORAL_CAPSULE | ORAL | Status: DC
  Administered 2018-12-18: 25 mg via ORAL
  Filled 2018-12-15: qty 1

## 2018-12-15 MED ORDER — THIAMINE HCL 100 MG/ML IJ SOLN: 100.0000 mg | Freq: Once | INTRAMUSCULAR | Status: DC

## 2018-12-15 MED ORDER — MAGNESIUM HYDROXIDE 400 MG/5ML PO SUSP: 30.0000 mL | Freq: Every day | ORAL | Status: DC | PRN

## 2018-12-15 MED ORDER — HYDROXYZINE HCL 50 MG PO TABS
50.0000 mg | ORAL_TABLET | Freq: Four times a day (QID) | ORAL | Status: DC | PRN
  Administered 2018-12-15 – 2018-12-18 (×10): 50 mg via ORAL
  Filled 2018-12-15 (×9): qty 1

## 2018-12-15 MED ORDER — GABAPENTIN 300 MG PO CAPS
300.0000 mg | ORAL_CAPSULE | Freq: Three times a day (TID) | ORAL | Status: DC
  Administered 2018-12-15: 300 mg via ORAL
  Filled 2018-12-15 (×7): qty 1

## 2018-12-15 MED ORDER — METHOCARBAMOL 750 MG PO TABS
750.0000 mg | ORAL_TABLET | Freq: Three times a day (TID) | ORAL | Status: DC
  Administered 2018-12-15 – 2018-12-18 (×11): 750 mg via ORAL
  Filled 2018-12-15 (×18): qty 1

## 2018-12-15 MED ORDER — CHLORDIAZEPOXIDE HCL 25 MG PO CAPS
25.0000 mg | ORAL_CAPSULE | Freq: Four times a day (QID) | ORAL | Status: AC | PRN
  Administered 2018-12-15: 25 mg via ORAL
  Filled 2018-12-15: qty 1

## 2018-12-15 MED ORDER — LORAZEPAM 1 MG PO TABS
2.0000 mg | ORAL_TABLET | Freq: Once | ORAL | Status: AC
  Administered 2018-12-15: 2 mg via ORAL

## 2018-12-15 MED ORDER — ONDANSETRON 4 MG PO TBDP
4.0000 mg | ORAL_TABLET | Freq: Four times a day (QID) | ORAL | Status: AC | PRN
  Administered 2018-12-16: 4 mg via ORAL
  Filled 2018-12-15: qty 1

## 2018-12-15 MED ORDER — TRAZODONE HCL 50 MG PO TABS: 50.0000 mg | ORAL_TABLET | Freq: Every evening | ORAL | Status: DC | PRN

## 2018-12-15 MED ORDER — ADULT MULTIVITAMIN W/MINERALS CH
1.0000 | ORAL_TABLET | Freq: Every day | ORAL | Status: DC
  Administered 2018-12-16 – 2018-12-18 (×3): 1 via ORAL
  Filled 2018-12-15 (×6): qty 1

## 2018-12-15 NOTE — Tx Team (Signed)
Initial Treatment Plan 12/15/2018 5:18 AM Alexandra Ball LKG:[id number]    PATIENT STRESSORS: Financial difficulties Medication change or noncompliance Substance abuse   PATIENT STRENGTHS: General fund of knowledge Motivation for treatment/growth   PATIENT IDENTIFIED PROBLEMS: "I feel bad and irritable"  "I need Help"                   DISCHARGE CRITERIA:  Improved stabilization in mood, thinking, and/or behavior Verbal commitment to aftercare and medication compliance Withdrawal symptoms are absent or subacute and managed without 24-hour nursing intervention  PRELIMINARY DISCHARGE PLAN: Attend 12-step recovery group Placement in alternative living arrangements  PATIENT/FAMILY INVOLVEMENT: This treatment plan has been presented to and reviewed with the patient, Alexandra Ball, and/or family member, .  The patient and family have been given the opportunity to ask questions and make suggestions.  Andrena Mews, RN 12/15/2018, 5:18 AM

## 2018-12-15 NOTE — BH Assessment (Signed)
[facility] Assessment Progress Note   Clinician talked with Alexandra Ball and she said patient could have [facility] 303-1. Attending psychiatrist will be Dr. Jama Flavors.  Nurse call report to [phone].  Patient to sign voluntary admission forms.  Please fax to [phone] prior to arrival.  Clinician informed nurse Leotis Shames Sappelt of disposition.

## 2018-12-15 NOTE — H&P (Signed)
Psychiatric Admission Assessment Adult  Patient Identification: Alexandra Ball MRN:  [id number] Date of Evaluation:  12/15/2018 Chief Complaint:  MDD Opiod Use disorder Amphetamine use disorder Principal Diagnosis: Depression/polysubstance intoxication and abuse/opiate dependency and withdrawal  Diagnosis:  Active Problems:   MDD (major depressive disorder), recurrent episode, severe (HCC)  History of Present Illness:  Patient is known to the service last here approximately 1 year ago in January 2019 at that point she was diagnosed with depression recurrent severe without psychosis, substance use disorder, and history of seizure disorder-she reports the seizures are related not only to drug withdrawal and but also underlying epilepsy - also at that time of her admission last year, the team considered substance-induced mood disorder bipolar type.  She acknowledged multiple symptoms of depression and had follow-up arranged with [facility]  This presentation involved several problems, reports of psychosis, homelessness, depression, and a drug screen that was positive for amphetamines, benzodiazepines and cannabis, she states she was dependent on heroin she and her husband went on a long distance trip by truck and she felt they have packed enough heroin for the journey but it turned out they went into withdrawal and she began abusing other compounds instead. Thus she presented on 1/22 with a chief complaint of requesting detox from heroin and methamphetamines, using methamphetamines the date of admission but heroin 2 days prior, shortly thereafter she reported hallucinations.  According to the assessment team  Patient says that she has had increasing depression.  She had been clean for 9 months and relapsed a few months ago.  Patient is depressed and anxious.    Patient says she has been using heroin as her drug of choice.  She uses 1/2 to a full gram IV heroin per day for the last few months.   When she cannot get heroin she will use methamphetamine.  She will use it IV and the amount varies, her last use was yesterday (01/22).    Patient denies SI, HI.  She has seen shadows but attributes this to being jupfor 3 days without eating.  Patient reports that she has epilepsy.  She will have withdrawal seizures occasionally and last time was over a month ago.  Patient is depressed about being homeless.  She has a court date coming up and knows to get in touch with her PO.  Patient is depressed also about no family contact.    According to nursing assessment Pt is a 38 year old female admitted with depression and opiod use disorder    Pt uds was positive for benzos meth and pot but not for opiates   She reports last use of heroine was a couple of days ago    Pt also reports seeing shadows and hearing voices   She is homeless and has a court date soon and has a Environmental education officerparoll officer    She is irritable during the assessment and is making grunting and groaning sounds    She reports she is epeleptic and had a seizure a month ago    Associated Signs/Symptoms: Depression Symptoms:  disturbed sleep, (Hypo) Manic Symptoms:  Hallucinations, Anxiety Symptoms:  n/a Psychotic Symptoms:  Hallucinations: Auditory PTSD Symptoms: NA Total Time spent with patient: 30 minutes 2 Is the patient at risk to self? No.  Has the patient been a risk to self in the past 6 months? No.  Has the patient been a risk to self within the distant past? No.  Is the patient a risk to others? No.  Has the patient been a risk to others in the past 6 months? No.  Has the patient been a risk to others within the distant past? No.   Prior Inpatient Therapy: Here 1 year ago Prior Outpatient Therapy: Not fully compliant in the past  Alcohol Screening: 1. How often do you have a drink containing alcohol?: Monthly or less 2. How many drinks containing alcohol do you have on a typical day when you are drinking?: 1 or 2 3. How often  do you have six or more drinks on one occasion?: Never AUDIT-C Score: 1 4. How often during the last year have you found that you were not able to stop drinking once you had started?: Never 5. How often during the last year have you failed to do what was normally expected from you becasue of drinking?: Never 6. How often during the last year have you needed a first drink in the morning to get yourself going after a heavy drinking session?: Never 7. How often during the last year have you had a feeling of guilt of remorse after drinking?: Never 8. How often during the last year have you been unable to remember what happened the night before because you had been drinking?: Never 9. Have you or someone else been injured as a result of your drinking?: No 10. Has a relative or friend or a doctor or another health worker been concerned about your drinking or suggested you cut down?: No Alcohol Use Disorder Identification Test Final Score (AUDIT): 1 Alcohol Brief Interventions/Follow-up: AUDIT Score <7 follow-up not indicated Substance Abuse History in the last 12 months:  Yes.   Consequences of Substance Abuse: Medical Consequences:  Seizures in the past Previous Psychotropic Medications: Yes  Psychological Evaluations: No  Past Medical History:  Past Medical History:  Diagnosis Date  . Bipolar disorder (HCC)   . Carpal tunnel syndrome   . Depression   . Noncompliance with medication regimen   . Seizures (HCC)    mild one 82months ago, off meds for pregnancy. Was on lamictil and dilatin. Last seizure 1 year ago as of 12/14/18  . Substance abuse (HCC)    Multiple substances    Past Surgical History:  Procedure Laterality Date  . CHOLECYSTECTOMY OPEN  2006   during second pregnancy  . OVARIAN CYST REMOVAL    . TUBAL LIGATION  07/01/2011   Procedure: POST PARTUM TUBAL LIGATION;  Surgeon: Scheryl Darter, MD;  Location: [facility];  Service: Gynecology;  Laterality: Bilateral;  With Filshie Clips     Family History: History reviewed. No pertinent family history.  Tobacco Screening: Have you used any form of tobacco in the last 30 days? (Cigarettes, Smokeless Tobacco, Cigars, and/or Pipes): Yes Tobacco use, Select all that apply: 5 or more cigarettes per day Are you interested in Tobacco Cessation Medications?: No, patient refused Counseled patient on smoking cessation including recognizing danger situations, developing coping skills and basic information about quitting provided: Refused/Declined practical counseling Social History:  Social History   Substance and Sexual Activity  Alcohol Use No   Comment: previos hx of drug abuse admitted to behavioral health in 2010; states occasionally on 03/06/12     Social History   Substance and Sexual Activity  Drug Use Yes  . Types: Other-see comments, Marijuana   Comment: heroin, meth, marijuana    Additional Social History:      Pain Medications: None Prescriptions: None Over the Counter: None History of alcohol / drug use?: Yes Longest  period of sobriety (when/how long): 9 months clean in 2018 Negative Consequences of Use: Personal relationships, Legal Withdrawal Symptoms: Sweats, Weakness, Tremors, Fever / Chills, Cramps, Nausea / Vomiting, Patient aware of relationship between substance abuse and physical/medical complications, Diarrhea, Seizures Onset of Seizures: has epilepsy.  Will withdrawl seizures Date of most recent seizure: A month ago. Name of Substance 1: Heroin (IV) 1 - Age of First Use: 38 years of age 53 - Amount (size/oz): 1/2 to 1 gram daily 1 - Frequency: Daily 1 - Duration: on-going 1 - Last Use / Amount: Couple of days ago Name of Substance 2: Methamphetamine (IV) 2 - Age of First Use: 38 years of age 62 - Amount (size/oz): 1/16 to a gram 2 - Frequency: Once every 3 months or so 2 - Duration: Uses when she cannot get heroin 2 - Last Use / Amount: 01/22                Allergies:   Allergies   Allergen Reactions  . Ultram [Tramadol Hcl] Hives and Swelling    Bodily swelling   Lab Results:  Results for orders placed or performed during the hospital encounter of 12/14/18 (from the past 48 hour(s))  Rapid urine drug screen (hospital performed)     Status: Abnormal   Collection Time: 12/14/18 10:34 PM  Result Value Ref Range   Opiates NONE DETECTED NONE DETECTED   Cocaine NONE DETECTED NONE DETECTED   Benzodiazepines POSITIVE (A) NONE DETECTED   Amphetamines POSITIVE (A) NONE DETECTED   Tetrahydrocannabinol POSITIVE (A) NONE DETECTED   Barbiturates NONE DETECTED NONE DETECTED    Comment: (NOTE) DRUG SCREEN FOR MEDICAL PURPOSES ONLY.  IF CONFIRMATION IS NEEDED FOR ANY PURPOSE, NOTIFY LAB WITHIN 5 DAYS. LOWEST DETECTABLE LIMITS FOR URINE DRUG SCREEN Drug Class                     Cutoff (ng/mL) Amphetamine and metabolites    1000 Barbiturate and metabolites    200 Benzodiazepine                 200 Tricyclics and metabolites     300 Opiates and metabolites        300 Cocaine and metabolites        300 THC                            50 Performed at [facility], [address] [place], [place] [address]   CBC with Differential/Platelet     Status: None   Collection Time: 12/14/18 10:36 PM  Result Value Ref Range   WBC 8.2 4.0 - 10.5 K/uL   RBC 4.37 3.87 - 5.11 MIL/uL   Hemoglobin 12.9 12.0 - 15.0 g/dL   HCT 60.4 54.0 - 98.1 %   MCV 91.3 80.0 - 100.0 fL   MCH 29.5 26.0 - 34.0 pg   MCHC 32.3 30.0 - 36.0 g/dL   RDW 19.1 47.8 - 29.5 %   Platelets 277 150 - 400 K/uL   nRBC 0.0 0.0 - 0.2 %   Neutrophils Relative % 75 %   Neutro Abs 6.1 1.7 - 7.7 K/uL   Lymphocytes Relative 17 %   Lymphs Abs 1.4 0.7 - 4.0 K/uL   Monocytes Relative 7 %   Monocytes Absolute 0.6 0.1 - 1.0 K/uL   Eosinophils Relative 1 %   Eosinophils Absolute 0.1 0.0 - 0.5 K/uL   Basophils Relative  0 %   Basophils Absolute 0.0 0.0 - 0.1 K/uL   Immature Granulocytes 0 %   Abs Immature Granulocytes  0.02 0.00 - 0.07 K/uL    Comment: Performed at [facility], [address], [place], [place] [address]  Comprehensive metabolic panel     Status: Abnormal   Collection Time: 12/14/18 10:36 PM  Result Value Ref Range   Sodium 138 135 - 145 mmol/L   Potassium 2.7 (LL) 3.5 - 5.1 mmol/L    Comment: CRITICAL RESULT CALLED TO, READ BACK BY AND VERIFIED WITH: J SAPPELT,RN @2325  12/14/18 MKELLY    Chloride 99 98 - 111 mmol/L   CO2 30 22 - 32 mmol/L   Glucose, Bld 86 70 - 99 mg/dL   BUN 13 6 - 20 mg/dL   Creatinine, Ser 6.04 0.44 - 1.00 mg/dL   Calcium 9.0 8.9 - 54.0 mg/dL   Total Protein 7.5 6.5 - 8.1 g/dL   Albumin 4.2 3.5 - 5.0 g/dL   AST 20 15 - 41 U/L   ALT 13 0 - 44 U/L   Alkaline Phosphatase 62 38 - 126 U/L   Total Bilirubin 1.4 (H) 0.3 - 1.2 mg/dL   GFR calc non Af Amer >60 >60 mL/min   GFR calc Af Amer >60 >60 mL/min   Anion gap 9 5 - 15    Comment: Performed at [facility], [address] [place], [place] [address]  Ethanol     Status: None   Collection Time: 12/14/18 10:36 PM  Result Value Ref Range   Alcohol, Ethyl (B) <10 <10 mg/dL    Comment: (NOTE) Lowest detectable limit for serum alcohol is 10 mg/dL. For medical purposes only. Performed at [facility], [address], [place], [place] [address]   Comprehensive metabolic panel     Status: Abnormal   Collection Time: 12/15/18 12:39 AM  Result Value Ref Range   Sodium 136 135 - 145 mmol/L   Potassium 2.9 (L) 3.5 - 5.1 mmol/L   Chloride 107 98 - 111 mmol/L   CO2 19 (L) 22 - 32 mmol/L   Glucose, Bld 89 70 - 99 mg/dL   BUN 15 6 - 20 mg/dL   Creatinine, Ser 9.56 0.44 - 1.00 mg/dL   Calcium 9.0 8.9 - 21.3 mg/dL   Total Protein 7.3 6.5 - 8.1 g/dL   Albumin 4.4 3.5 - 5.0 g/dL   AST 18 15 - 41 U/L   ALT 14 0 - 44 U/L   Alkaline Phosphatase 77 38 - 126 U/L   Total Bilirubin 0.6 0.3 - 1.2 mg/dL   GFR calc non Af Amer >60 >60 mL/min   GFR calc Af Amer >60 >60 mL/min   Anion gap 10 5 - 15    Comment: Performed at  [facility], [address] [place], [place] [address]  Ethanol     Status: None   Collection Time: 12/15/18 12:39 AM  Result Value Ref Range   Alcohol, Ethyl (B) <10 <10 mg/dL    Comment: (NOTE) Lowest detectable limit for serum alcohol is 10 mg/dL. For medical purposes only. Performed at [facility], [address], [place], [place] [address]   CBC with Differential/Platelet     Status: None   Collection Time: 12/15/18  1:22 AM  Result Value Ref Range   WBC 8.6 4.0 - 10.5 K/uL   RBC 4.51 3.87 - 5.11 MIL/uL   Hemoglobin 13.5 12.0 - 15.0 g/dL   HCT 29.5 28.4 - 13.2 %  MCV 86.9 80.0 - 100.0 fL   MCH 29.9 26.0 - 34.0 pg   MCHC 34.4 30.0 - 36.0 g/dL   RDW 91.413.1 78.211.5 - 95.615.5 %   Platelets 200 150 - 400 K/uL   nRBC 0.0 0.0 - 0.2 %   Neutrophils Relative % 45 %   Neutro Abs 3.6 1.7 - 7.7 K/uL   Lymphocytes Relative 41 %   Lymphs Abs 3.2 0.7 - 4.0 K/uL   Monocytes Relative 9 %   Monocytes Absolute 0.7 0.1 - 1.0 K/uL   Eosinophils Relative 3 %   Eosinophils Absolute 0.2 0.0 - 0.5 K/uL   Basophils Relative 1 %   Basophils Absolute 0.1 0.0 - 0.1 K/uL   Immature Granulocytes 1 %   Abs Immature Granulocytes 0.04 0.00 - 0.07 K/uL    Comment: Performed at [facility], [address] [place], [place] [id number]    Blood Alcohol level:  Lab Results  Component Value Date   ETH <10 12/15/2018   ETH <10 12/14/2018    Metabolic Disorder Labs:  No results found for: HGBA1C, MPG No results found for: PROLACTIN No results found for: CHOL, TRIG, HDL, CHOLHDL, VLDL, LDLCALC  Current Medications: Current Facility-Administered Medications  Medication Dose Route Frequency Provider Last Rate Last Dose  . acetaminophen (TYLENOL) tablet 650 mg  650 mg Oral Q6H PRN Kerry HoughSimon, Spencer E, PA-C   650 mg at 12/15/18 [id number]  . alum & mag hydroxide-simeth (MAALOX/MYLANTA) 200-200-20 MG/5ML suspension 30 mL  30 mL Oral Q4H PRN Donell SievertSimon, Spencer E, PA-C      . feeding supplement (ENSURE ENLIVE) (ENSURE ENLIVE)  liquid 237 mL  237 mL Oral BID BM Donell SievertSimon, Spencer E, PA-C   237 mL at 12/15/18 0944  . gabapentin (NEURONTIN) capsule 300 mg  300 mg Oral TID Malvin JohnsFarah, Luis Nickles, MD      . hydrOXYzine (ATARAX/VISTARIL) tablet 50 mg  50 mg Oral Q6H PRN Donell SievertSimon, Spencer E, PA-C   50 mg at 12/15/18 [id number]  . magnesium hydroxide (MILK OF MAGNESIA) suspension 30 mL  30 mL Oral Daily PRN Kerry HoughSimon, Spencer E, PA-C      . methocarbamol (ROBAXIN) tablet 750 mg  750 mg Oral TID Malvin JohnsFarah, Yoav Okane, MD      . nicotine polacrilex (NICORETTE) gum 2 mg  2 mg Oral PRN Cobos, Rockey SituFernando A, MD   2 mg at 12/15/18 0950  . traZODone (DESYREL) tablet 50 mg  50 mg Oral QHS PRN Kerry HoughSimon, Spencer E, PA-C       PTA Medications: No medications prior to admission.    Musculoskeletal: Strength & Muscle Tone: within normal limits Gait & Station: normal Patient leans: N/A  Psychiatric Specialty Exam: Physical Exam  ROS  Blood pressure 114/84, pulse 94, temperature (!) 97.5 F (36.4 C), temperature source Oral, resp. rate 20, height 5\' 2"  (1.575 m), weight 71.1 kg, last menstrual period 11/30/2018.Body mass index is 28.67 kg/m.  General Appearance: Casual and Disheveled  Eye Contact:  Fair  Speech:  Clear and Coherent  Volume:  Normal  Mood:  Dysphoric  Affect:  Congruent  Thought Process:  Goal Directed  Orientation:  Full (Time, Place, and Person)  Thought Content:  Logical  Suicidal Thoughts:  No  Homicidal Thoughts:  No  Memory:  Immediate;   Poor  Judgement:  Fair  Insight:  Fair  Psychomotor Activity:  Normal  Concentration:  Concentration: Fair  Recall:  [employer]Fair  Fund of Knowledge:  Fair  Language:  Fair  Akathisia:  Negative  Handed:  Right  AIMS (if indicated):     Assets:  Physical Health Resilience  ADL's:  Intact  Cognition:  WNL  Sleep:  Number of Hours: 0.5    Treatment Plan Summary: Daily contact with patient to assess and evaluate symptoms and progress in treatment, Medication management and Plan Plans are to begin  anticonvulsant, add medications for not only seizures but withdrawal, continue current therapies that are cognitive and rehab based seek some type of housing/rehab at discharge  Observation Level/Precautions:  15 minute checks  Laboratory:  UDS  Psychotherapy: Cognitive/rehab based  Medications: Multiple adjustments made  Consultations: Possible neurology if seizures not under control by discharge  Discharge Concerns: Long-term housing long-term compliance long-term sobriety  Estimated LOS: 5 days  Other: See orders   Physician Treatment Plan for Primary Diagnosis: <principal problem not specified> Long Term Goal(s): Improvement in symptoms so as ready for discharge  Short Term Goals: Ability to maintain clinical measurements within normal limits will improve and Compliance with prescribed medications will improve  Physician Treatment Plan for Secondary Diagnosis: Active Problems:   MDD (major depressive disorder), recurrent episode, severe (HCC)  Long Term Goal(s): Improvement in symptoms so as ready for discharge  Short Term Goals: Compliance with prescribed medications will improve and Ability to identify triggers associated with substance abuse/mental health issues will improve  I certify that inpatient services furnished can reasonably be expected to improve the patient's condition.    Malvin Johns, MD 1/23/202010:11 AM

## 2018-12-15 NOTE — Progress Notes (Signed)
CSW met with pt individually to discuss her concerns regarding her court date in [place] tomorrow. PT gave CSW permission to contact her Engineer, manufacturing systems (Officer Warner Mccreedy) [phone] to notify him of her hospitalization as well. CSW left message for Officer Doug Sou notifying him of pt's hospitalization and requesting call back at his earliest convenience. With pt consent, CSW also faxed hospital note to [place] [phone]) and [facility] at [employer]: [phone]. Pt provided with fax and confirmation slips for her records and was encouraged to follow-up with both her Probation officer and the courthouse regarding her court date.  Alexandra Ball S. Alexandra Ball, MSW, LCSW Clinical Social Worker 12/15/2018 10:01 AM

## 2018-12-15 NOTE — ED Notes (Signed)
Pt wanded by security changed in to [facility] hospital scrubs and wanded by security again. Pt belongings secured in 2 personal belongings bags. Pt also present with 2 suitcases and duffel bag full of Pt belongings

## 2018-12-15 NOTE — ED Notes (Signed)
Blood Work Ran on this Pt was labeled in Error. Blood work re-ordered and collected with correct labels. Initial blood work ran under this Pts labels belonged to another Pt.

## 2018-12-15 NOTE — ED Notes (Signed)
TTS Cart at bedside 

## 2018-12-15 NOTE — [facility] Suicide Risk Assessment (Signed)
[facility] INPATIENT:  Family/Significant Other Suicide Prevention Education  Suicide Prevention Education:  Patient Refusal for Family/Significant Other Suicide Prevention Education: The patient Alexandra Ball has refused to provide written consent for family/significant other to be provided Family/Significant Other Suicide Prevention Education during admission and/or prior to discharge.  Physician notified.  SPE completed with pt, as pt refused to consent to family contact. SPI pamphlet provided to pt and pt was encouraged to share information with support network, ask questions, and talk about any concerns relating to SPE. Pt denies access to guns/firearms and verbalized understanding of information provided. Mobile Crisis information also provided to pt.   Rona Ravens LCSW 12/15/2018, 10:58 AM

## 2018-12-15 NOTE — Progress Notes (Signed)
Pt is a 38 year old female admitted with depression and opiod use disorder    Pt uds was positive for benzos meth and pot but not for opiates   She reports last use of heroine was a couple of days ago    Pt also reports seeing shadows and hearing voices   She is homeless and has a court date soon and has a Environmental education officerparoll officer    She is irritable during the assessment and is making grunting and groaning sounds    She reports she is epeleptic and had a seizure a month ago    Pt was oriented to the unit and given nourishment    Q 15 min checks initiated   Orders received and pt given medications for moderate withdrawal symptoms    Pt is adjusting well and in her room resting in bed

## 2018-12-15 NOTE — Progress Notes (Signed)
Referral made to [facility] per pt request.  Ethel Rana. Alan Ripper, MSW, LCSW Clinical Social Worker 12/15/2018 2:54 PM

## 2018-12-15 NOTE — Plan of Care (Addendum)
Patient was anxious and irritable upon approach. Endorses withdrawal symptoms such as anxiety, sweats, and headache. Patient endorses auditory hallucinations of a man with a demonic voice whispering in her ear. Patient is responding and keeps saying "STOP IT. SHUT UP." Support and encouragement was given to patient. PRN medications administered.  Patient was seen interacting well with other patients in the day room a couple hours later. Safety maintained with 15 minute checks. Will continue to monitor.  Problem: Education: Goal: Knowledge of the prescribed therapeutic regimen will improve Outcome: Progressing   Problem: Activity: Goal: Interest or engagement in leisure activities will improve Outcome: Progressing Goal: Imbalance in normal sleep/wake cycle will improve Outcome: Progressing   Problem: Coping: Goal: Coping ability will improve Outcome: Progressing Goal: Will verbalize feelings Outcome: Progressing
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
## 2018-12-15 NOTE — ED Notes (Signed)
Critical note for 2324-charted incorrectly, blood tubes were mislabeled and this critical value does not apply to this pt, EDP and lab notified

## 2018-12-15 NOTE — ED Provider Notes (Signed)
Received fall and call from Louann at behavioral health.  Patient can be transferred to behavioral health before 4 AM.  Patient has been given 1 dose of potassium, will be given a second dose prior to discharge.   Gilda Crease, MD 12/15/18 [phone]

## 2018-12-15 NOTE — Progress Notes (Signed)
Nutrition Brief Note  Patient identified on the Malnutrition Screening Tool (MST) Report  Wt Readings from Last 15 Encounters:  12/15/18 71.1 kg  12/14/18 77.1 kg  11/27/17 66.7 kg  11/26/17 68 kg  05/01/16 65.8 kg  11/10/15 68 kg  07/22/15 68 kg  07/11/15 68 kg  11/13/14 65.8 kg  09/01/14 63.5 kg  07/09/12 63.5 kg  07/05/12 63.5 kg  03/06/12 65.8 kg  10/18/11 70.3 kg  06/30/11 72.1 kg    Body mass index is 28.67 kg/m. Patient meets criteria for overweight based on current BMI. Patient admitted d/t request for help with detox from heroin and meth. Patient reported recently becoming homeless d/t eviction from her previous place of residence.  Current diet order is Regular and patient is eating as desired for meals and snacks at this time. Labs and medications reviewed.   Ensure Enlive was ordered BID per ONS protocol at the time of admission and patient to be offered the first bottle later this AM. No additional nutrition interventions warranted at this time. If nutrition issues arise, please consult RD.     Trenton Gammon, MS, RD, LDN, [facility] Inpatient Clinical Dietitian Pager # [phone] After hours/weekend pager # [phone]

## 2018-12-15 NOTE — B[facility] Group Notes (Signed)
LCSW Group Therapy Note  12/15/2018 2:36 PM  Type of Therapy/Topic:  Group Therapy:  Balance in Life  Participation Level:  Did Not Attend  Description of Group:     This group will address the concept of balance and how it feels and looks when one is unbalanced. Patients will be encouraged to process areas in their lives that are out of balance and identify reasons for remaining unbalanced. Facilitators will guide patients in utilizing problem-solving interventions to address and correct the stressor making their life unbalanced. Understanding and applying boundaries will be explored and addressed for obtaining and maintaining a balanced life. Patients will be encouraged to explore ways to assertively make their unbalanced needs known to significant others in their lives, using other group members and facilitator for support and feedback.  Marian Sorrow MSW Intern Phone: [phone] Fax: [phone]

## 2018-12-15 NOTE — BHH Suicide Risk Assessment (Signed)
[facility] Admission Suicide Risk Assessment   Nursing information obtained from:  Patient Demographic factors:  Low socioeconomic status, Unemployed, Caucasian Current Mental Status:  Suicidal ideation indicated by patient Loss Factors:  Financial problems / change in socioeconomic status Historical Factors:  NA Risk Reduction Factors:  NA  Total Time spent with patient: 45 minutes Principal Problem-polysubstance abuse/intoxication  diagnosis:  Active Problems:   MDD (major depressive disorder), recurrent episode, severe (HCC)  Subjective Data: Patient readmitted is turned to multiple other compounds when she ran out of heroin, states she and her husband were on a trip and hope they had packed enough heroin but it turned out they did not.  Continued Clinical Symptoms:  Alcohol Use Disorder Identification Test Final Score (AUDIT): 1 The "Alcohol Use Disorders Identification Test", Guidelines for Use in Primary Care, Second Edition.  World Science writer [facility]). Score between 0-7:  no or low risk or alcohol related problems. Score between 8-15:  moderate risk of alcohol related problems. Score between 16-19:  high risk of alcohol related problems. Score 20 or above:  warrants further diagnostic evaluation for alcohol dependence and treatment. Currently is alert oriented to person place time situation affect appropriate for the situation reporting some withdrawal symptoms without thoughts of harming self or others  CLINICAL FACTORS:   More than one psychiatric diagnosis     COGNITIVE FEATURES THAT CONTRIBUTE TO RISK:  None    SUICIDE RISK:   Minimal: No identifiable suicidal ideation.  Patients presenting with no risk factors but with morbid ruminations; may be classified as minimal risk based on the severity of the depressive symptoms  PLAN OF CARE: Continue current detox measures add muscle relaxant and gabapentin however patient's greatest risk of suicide would be an unintentional  overdose due to the polysubstance abuse and intoxications that are frequent  I certify that inpatient services furnished can reasonably be expected to improve the patient's condition.   Malvin Johns, MD 12/15/2018, 10:09 AM

## 2018-12-16 NOTE — Progress Notes (Signed)
D: Patient observed restless on unit tonight. On phone frequently. Patient states she is feeling "awful" and describes multiple withdrawal symptoms - agitation (anger), restless, pins and needles, shakiness/slight tremor, some sweating. CIWA "12", VSS. Patient's affect animated and agitated with congruent mood. Irritable at times. Speech rapid and pressured. Denies pain.   A: Discussed with Simon, PA as patient was not ordered protocol on admit even with +UDS for benzos and hx of seizure disorder. Medicated per orders, prn vistaril and librium given for anxiety and withdrawal. Medication education provided. Level III obs in place for safety. Emotional support offered.  Encouraged to attend and participate in unit programming.  Fall prevention plan in place and reviewed with patient as pt is a high fall risk due to detox, seizures.   R: Patient verbalizes understanding of POC, falls prevention education. On reassess, patient is asleep at this time. Patient yelling out frequently in her sleep, cursing, upset, sneezing so loud it can be heard throughout hallway and up at NS. Patient's roommate had to be moved to the quiet room as roommate could not sleep. Patient denies SI/HI/AVH and remains safe on level III obs. Will continue to monitor throughout the night.

## 2018-12-16 NOTE — Progress Notes (Signed)
[place] MD Progress Note  12/16/2018 9:27 AM Alexandra Ball  MRN:  [id number] Subjective:    Patient reports minimal withdrawal symptoms today believes the detox measures are working she has no cravings, focused on obtaining some type of meaningful rehab as she will be homeless no thoughts of harming self or others and she is able to contract here. Depression does seem improved she had requested Wellbutrin However I told her that would lower seizure threshold while she is in withdrawal and she accepts this. 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 Bipolar disorder (HCC)   . Carpal tunnel syndrome   . Depression   . Noncompliance with medication regimen   . Seizures (HCC)    mild one 6months ago, off meds for pregnancy. Was on lamictil and dilatin. Last seizure 1 year ago as of 12/14/18  . Substance abuse (HCC)    Multiple substances    Past Surgical History:  Procedure Laterality Date  . CHOLECYSTECTOMY OPEN  2006   during second pregnancy  . OVARIAN CYST REMOVAL    . TUBAL LIGATION  07/01/2011   Procedure: POST PARTUM TUBAL LIGATION;  Surgeon: Scheryl Darter, MD;  Location: [facility];  Service: Gynecology;  Laterality: Bilateral;  With Filshie Clips    Family History: History reviewed. No pertinent family history.  Social History:  Social History   Substance and Sexual Activity  Alcohol Use No   Comment: previos hx of drug abuse admitted to behavioral health in 2010; states occasionally on 03/06/12     Social History   Substance and Sexual Activity  Drug Use Yes  . Types:  Other-see comments, Marijuana   Comment: heroin, meth, marijuana    Social History   Socioeconomic History  . Marital status: Married    Spouse name: Not on file  . Number of children: Not on file  . Years of education: Not on file  . Highest education level: Not on file  Occupational History  . Not on file  Social Needs  . Financial resource strain: Not on file  . Food insecurity:    Worry: Not on file    Inability: Not on file  . Transportation needs:    Medical: Not on file    Non-medical: Not on file  Tobacco Use  . Smoking status: Current Every Day Smoker    Packs/day: 1.00    Years: 10.00    Pack years: 10.00    Types: Cigarettes  . Smokeless tobacco: Never Used  Substance and Sexual Activity  . Alcohol use: No    Comment: previos hx of drug abuse admitted to behavioral health in 2010; states occasionally on 03/06/12  . Drug use: Yes    Types: Other-see comments, Marijuana    Comment: heroin, meth, marijuana  . Sexual activity: Yes    Birth control/protection: Surgical    Comment: denies   Lifestyle  . Physical activity:    Days per week: Not on file    Minutes per session: Not on file  . Stress: Not on file  Relationships  . Social connections:    Talks on phone: Not on file    Gets together: Not on file  Attends religious service: Not on file    Active member of club or organization: Not on file    Attends meetings of clubs or organizations: Not on file    Relationship status: Not on file  Other Topics Concern  . Not on file  Social History Narrative  . Not on file   Additional Social History:    Pain Medications: None Prescriptions: None Over the Counter: None History of alcohol / drug use?: Yes Longest period of sobriety (when/how long): 9 months clean in 2018 Negative Consequences of Use: Personal relationships, Legal Withdrawal Symptoms: Sweats, Weakness, Tremors, Fever / Chills, Cramps, Nausea / Vomiting, Patient aware of relationship  between substance abuse and physical/medical complications, Diarrhea, Seizures Onset of Seizures: has epilepsy.  Will withdrawl seizures Date of most recent seizure: A month ago. Name of Substance 1: Heroin (IV) 1 - Age of First Use: 38 years of age 595 - Amount (size/oz): 1/2 to 1 gram daily 1 - Frequency: Daily 1 - Duration: on-going 1 - Last Use / Amount: Couple of days ago Name of Substance 2: Methamphetamine (IV) 2 - Age of First Use: 38 years of age 58 - Amount (size/oz): 1/16 to a gram 2 - Frequency: Once every 3 months or so 2 - Duration: Uses when she cannot get heroin 2 - Last Use / Amount: 01/22                Sleep: Fair  Appetite:  Fair  Current Medications: Current Facility-Administered Medications  Medication Dose Route Frequency Provider Last Rate Last Dose  . acetaminophen (TYLENOL) tablet 650 mg  650 mg Oral Q6H PRN Kerry HoughSimon, Spencer E, PA-C   650 mg at 12/16/18 0630  . alum & mag hydroxide-simeth (MAALOX/MYLANTA) 200-200-20 MG/5ML suspension 30 mL  30 mL Oral Q4H PRN Donell SievertSimon, Spencer E, PA-C      . chlordiazePOXIDE (LIBRIUM) capsule 25 mg  25 mg Oral Q6H PRN Donell SievertSimon, Spencer E, PA-C   25 mg at 12/15/18 2241  . chlordiazePOXIDE (LIBRIUM) capsule 25 mg  25 mg Oral QID Kerry HoughSimon, Spencer E, PA-C   25 mg at 12/16/18 [id number]   Followed by  . [START ON 12/17/2018] chlordiazePOXIDE (LIBRIUM) capsule 25 mg  25 mg Oral TID Kerry HoughSimon, Spencer E, PA-C       Followed by  . [START ON 12/18/2018] chlordiazePOXIDE (LIBRIUM) capsule 25 mg  25 mg Oral BH-qamhs Simon, Spencer E, PA-C       Followed by  . [START ON 12/20/2018] chlordiazePOXIDE (LIBRIUM) capsule 25 mg  25 mg Oral Daily Simon, Spencer E, PA-C      . feeding supplement (ENSURE ENLIVE) (ENSURE ENLIVE) liquid 237 mL  237 mL Oral BID BM Donell SievertSimon, Spencer E, PA-C   237 mL at 12/15/18 0944  . hydrOXYzine (ATARAX/VISTARIL) tablet 50 mg  50 mg Oral Q6H PRN Kerry HoughSimon, Spencer E, PA-C   50 mg at 12/15/18 2145  . loperamide (IMODIUM) capsule 2-4 mg   2-4 mg Oral PRN Donell SievertSimon, Spencer E, PA-C      . magnesium hydroxide (MILK OF MAGNESIA) suspension 30 mL  30 mL Oral Daily PRN Kerry HoughSimon, Spencer E, PA-C      . methocarbamol (ROBAXIN) tablet 750 mg  750 mg Oral TID Malvin JohnsFarah, Ainara Eldridge, MD   750 mg at 12/16/18 0824  . mirtazapine (REMERON SOL-TAB) disintegrating tablet 30 mg  30 mg Oral QHS Malvin JohnsFarah, Irelyn Perfecto, MD   30 mg at 12/15/18 2144  . multivitamin with minerals tablet 1 tablet  1  tablet Oral Daily Kerry Hough, PA-C   1 tablet at 12/16/18 1610  . nicotine (NICODERM CQ - dosed in mg/24 hours) patch 21 mg  21 mg Transdermal Daily Malvin Johns, MD   21 mg at 12/16/18 9604  . ondansetron (ZOFRAN-ODT) disintegrating tablet 4 mg  4 mg Oral Q6H PRN Kerry Hough, PA-C   4 mg at 12/16/18 5409  . phenytoin (DILANTIN) ER capsule 300 mg  300 mg Oral QHS Malvin Johns, MD   300 mg at 12/15/18 2144  . thiamine (B-1) injection 100 mg  100 mg Intramuscular Once Donell Sievert E, PA-C      . thiamine (VITAMIN B-1) tablet 100 mg  100 mg Oral Daily Kerry Hough, PA-C   100 mg at 12/16/18 8119    Lab Results:  Results for orders placed or performed during the hospital encounter of 12/14/18 (from the past 48 hour(s))  Rapid urine drug screen (hospital performed)     Status: Abnormal   Collection Time: 12/14/18 10:34 PM  Result Value Ref Range   Opiates NONE DETECTED NONE DETECTED   Cocaine NONE DETECTED NONE DETECTED   Benzodiazepines POSITIVE (A) NONE DETECTED   Amphetamines POSITIVE (A) NONE DETECTED   Tetrahydrocannabinol POSITIVE (A) NONE DETECTED   Barbiturates NONE DETECTED NONE DETECTED    Comment: (NOTE) DRUG SCREEN FOR MEDICAL PURPOSES ONLY.  IF CONFIRMATION IS NEEDED FOR ANY PURPOSE, NOTIFY LAB WITHIN 5 DAYS. LOWEST DETECTABLE LIMITS FOR URINE DRUG SCREEN Drug Class                     Cutoff (ng/mL) Amphetamine and metabolites    1000 Barbiturate and metabolites    200 Benzodiazepine                 200 Tricyclics and metabolites      300 Opiates and metabolites        300 Cocaine and metabolites        300 THC                            50 Performed at [facility], [address] [place], [place] [address]   CBC with Differential/Platelet     Status: None   Collection Time: 12/14/18 10:36 PM  Result Value Ref Range   WBC  4.0 - 10.5 K/uL    PATIENT IDENTIFICATION ERROR. PLEASE DISREGARD RESULTS. ACCOUNT WILL BE CREDITED.    Comment: CORRECTED ON 01/23 AT 1031: PREVIOUSLY REPORTED AS 8.2   RBC  3.87 - 5.11 MIL/uL    PATIENT IDENTIFICATION ERROR. PLEASE DISREGARD RESULTS. ACCOUNT WILL BE CREDITED.    Comment: CORRECTED ON 01/23 AT 1031: PREVIOUSLY REPORTED AS 4.37   Hemoglobin  12.0 - 15.0 g/dL    PATIENT IDENTIFICATION ERROR. PLEASE DISREGARD RESULTS. ACCOUNT WILL BE CREDITED.    Comment: CORRECTED ON 01/23 AT 1031: PREVIOUSLY REPORTED AS 12.9   HCT  36.0 - 46.0 %    PATIENT IDENTIFICATION ERROR. PLEASE DISREGARD RESULTS. ACCOUNT WILL BE CREDITED.    Comment: CORRECTED ON 01/23 AT 1031: PREVIOUSLY REPORTED AS 39.9   MCV  80.0 - 100.0 fL    PATIENT IDENTIFICATION ERROR. PLEASE DISREGARD RESULTS. ACCOUNT WILL BE CREDITED.    Comment: CORRECTED ON 01/23 AT 1031: PREVIOUSLY REPORTED AS 91.3   MCH  26.0 - 34.0 pg    PATIENT IDENTIFICATION ERROR. PLEASE DISREGARD RESULTS. ACCOUNT WILL BE CREDITED.    Comment:  CORRECTED ON 01/23 AT 1031: PREVIOUSLY REPORTED AS 29.5   MCHC  30.0 - 36.0 g/dL    PATIENT IDENTIFICATION ERROR. PLEASE DISREGARD RESULTS. ACCOUNT WILL BE CREDITED.    Comment: CORRECTED ON 01/23 AT 1031: PREVIOUSLY REPORTED AS 32.3   RDW  11.5 - 15.5 %    PATIENT IDENTIFICATION ERROR. PLEASE DISREGARD RESULTS. ACCOUNT WILL BE CREDITED.    Comment: CORRECTED ON 01/23 AT 1031: PREVIOUSLY REPORTED AS 13.4   Platelets  150 - 400 K/uL    PATIENT IDENTIFICATION ERROR. PLEASE DISREGARD RESULTS. ACCOUNT WILL BE CREDITED.    Comment: CORRECTED ON 01/23 AT 1031: PREVIOUSLY REPORTED AS 277   nRBC  0.0 - 0.2 %     PATIENT IDENTIFICATION ERROR. PLEASE DISREGARD RESULTS. ACCOUNT WILL BE CREDITED.    Comment: CORRECTED ON 01/23 AT 1031: PREVIOUSLY REPORTED AS 0.0   Neutrophils Relative %  %    PATIENT IDENTIFICATION ERROR. PLEASE DISREGARD RESULTS. ACCOUNT WILL BE CREDITED.    Comment: CORRECTED ON 01/23 AT 1031: PREVIOUSLY REPORTED AS 75   Neutro Abs  1.7 - 7.7 K/uL    PATIENT IDENTIFICATION ERROR. PLEASE DISREGARD RESULTS. ACCOUNT WILL BE CREDITED.    Comment: CORRECTED ON 01/23 AT 1031: PREVIOUSLY REPORTED AS 6.1   Lymphocytes Relative  %    PATIENT IDENTIFICATION ERROR. PLEASE DISREGARD RESULTS. ACCOUNT WILL BE CREDITED.    Comment: CORRECTED ON 01/23 AT 1031: PREVIOUSLY REPORTED AS 17   Lymphs Abs  0.7 - 4.0 K/uL    PATIENT IDENTIFICATION ERROR. PLEASE DISREGARD RESULTS. ACCOUNT WILL BE CREDITED.    Comment: CORRECTED ON 01/23 AT 1031: PREVIOUSLY REPORTED AS 1.4   Monocytes Relative  %    PATIENT IDENTIFICATION ERROR. PLEASE DISREGARD RESULTS. ACCOUNT WILL BE CREDITED.    Comment: CORRECTED ON 01/23 AT 1031: PREVIOUSLY REPORTED AS 7   Monocytes Absolute  0.1 - 1.0 K/uL    PATIENT IDENTIFICATION ERROR. PLEASE DISREGARD RESULTS. ACCOUNT WILL BE CREDITED.    Comment: CORRECTED ON 01/23 AT 1031: PREVIOUSLY REPORTED AS 0.6   Eosinophils Relative  %    PATIENT IDENTIFICATION ERROR. PLEASE DISREGARD RESULTS. ACCOUNT WILL BE CREDITED.    Comment: CORRECTED ON 01/23 AT 1031: PREVIOUSLY REPORTED AS 1   Eosinophils Absolute  0.0 - 0.5 K/uL    PATIENT IDENTIFICATION ERROR. PLEASE DISREGARD RESULTS. ACCOUNT WILL BE CREDITED.    Comment: CORRECTED ON 01/23 AT 1031: PREVIOUSLY REPORTED AS 0.1   Basophils Relative  %    PATIENT IDENTIFICATION ERROR. PLEASE DISREGARD RESULTS. ACCOUNT WILL BE CREDITED.    Comment: CORRECTED ON 01/23 AT 1031: PREVIOUSLY REPORTED AS 0   Basophils Absolute  0.0 - 0.1 K/uL    PATIENT IDENTIFICATION ERROR. PLEASE DISREGARD RESULTS. ACCOUNT WILL BE CREDITED.    Comment: CORRECTED ON  01/23 AT 1031: PREVIOUSLY REPORTED AS 0.0   Immature Granulocytes 0 %   Abs Immature Granulocytes 0.02 0.00 - 0.07 K/uL    Comment: Performed at [facility], [address], [place], [place] [address]  Comprehensive metabolic panel     Status: None   Collection Time: 12/14/18 10:36 PM  Result Value Ref Range   Sodium  135 - 145 mmol/L    PATIENT IDENTIFICATION ERROR. PLEASE DISREGARD RESULTS. ACCOUNT WILL BE CREDITED.    Comment: CORRECTED ON 01/23 AT 1035: PREVIOUSLY REPORTED AS 138   Potassium  3.5 - 5.1 mmol/L    PATIENT IDENTIFICATION ERROR. PLEASE DISREGARD RESULTS. ACCOUNT WILL BE CREDITED.    Comment: CRITICAL RESULT CALLED TO, READ  BACK BY AND VERIFIED WITH: J SAPPELT,RN @2325  12/14/18 MKELLY CORRECTED ON 01/23 AT 1035: PREVIOUSLY REPORTED AS 2.7 CRITICAL RESULT CALLED TO, READ BACK BY AND VERIFIED WITH: J SAPPELT,RN @2325  12/14/18 MKELLY    Chloride  98 - 111 mmol/L    PATIENT IDENTIFICATION ERROR. PLEASE DISREGARD RESULTS. ACCOUNT WILL BE CREDITED.    Comment: CORRECTED ON 01/23 AT 1035: PREVIOUSLY REPORTED AS 99   CO2  22 - 32 mmol/L    PATIENT IDENTIFICATION ERROR. PLEASE DISREGARD RESULTS. ACCOUNT WILL BE CREDITED.    Comment: CORRECTED ON 01/23 AT 1035: PREVIOUSLY REPORTED AS 30   Glucose, Bld  70 - 99 mg/dL    PATIENT IDENTIFICATION ERROR. PLEASE DISREGARD RESULTS. ACCOUNT WILL BE CREDITED.    Comment: CORRECTED ON 01/23 AT 1035: PREVIOUSLY REPORTED AS 86   BUN  6 - 20 mg/dL    PATIENT IDENTIFICATION ERROR. PLEASE DISREGARD RESULTS. ACCOUNT WILL BE CREDITED.    Comment: CORRECTED ON 01/23 AT 1035: PREVIOUSLY REPORTED AS 13   Creatinine, Ser  0.44 - 1.00 mg/dL    PATIENT IDENTIFICATION ERROR. PLEASE DISREGARD RESULTS. ACCOUNT WILL BE CREDITED.    Comment: CORRECTED ON 01/23 AT 1035: PREVIOUSLY REPORTED AS 0.82   Calcium  8.9 - 10.3 mg/dL    PATIENT IDENTIFICATION ERROR. PLEASE DISREGARD RESULTS. ACCOUNT WILL BE CREDITED.    Comment: CORRECTED ON 01/23 AT 1035:  PREVIOUSLY REPORTED AS 9.0   Total Protein  6.5 - 8.1 g/dL    PATIENT IDENTIFICATION ERROR. PLEASE DISREGARD RESULTS. ACCOUNT WILL BE CREDITED.    Comment: CORRECTED ON 01/23 AT 1035: PREVIOUSLY REPORTED AS 7.5   Albumin  3.5 - 5.0 g/dL    PATIENT IDENTIFICATION ERROR. PLEASE DISREGARD RESULTS. ACCOUNT WILL BE CREDITED.    Comment: CORRECTED ON 01/23 AT 1035: PREVIOUSLY REPORTED AS 4.2   AST  15 - 41 U/L    PATIENT IDENTIFICATION ERROR. PLEASE DISREGARD RESULTS. ACCOUNT WILL BE CREDITED.    Comment: CORRECTED ON 01/23 AT 1035: PREVIOUSLY REPORTED AS 20   ALT  0 - 44 U/L    PATIENT IDENTIFICATION ERROR. PLEASE DISREGARD RESULTS. ACCOUNT WILL BE CREDITED.    Comment: CORRECTED ON 01/23 AT 1035: PREVIOUSLY REPORTED AS 13   Alkaline Phosphatase  38 - 126 U/L    PATIENT IDENTIFICATION ERROR. PLEASE DISREGARD RESULTS. ACCOUNT WILL BE CREDITED.    Comment: CORRECTED ON 01/23 AT 1035: PREVIOUSLY REPORTED AS 62   Total Bilirubin  0.3 - 1.2 mg/dL    PATIENT IDENTIFICATION ERROR. PLEASE DISREGARD RESULTS. ACCOUNT WILL BE CREDITED.    Comment: CORRECTED ON 01/23 AT 1035: PREVIOUSLY REPORTED AS 1.4   GFR calc non Af Amer NOT CALCULATED >60 mL/min    Comment: CORRECTED ON 01/23 AT 1035: PREVIOUSLY REPORTED AS >60   GFR calc Af Amer NOT CALCULATED >60 mL/min    Comment: CORRECTED ON 01/23 AT 1035: PREVIOUSLY REPORTED AS >60   Anion gap 9 5 - 15    Comment: Performed at [facility], [address] [place], [place] [address]  Ethanol     Status: None   Collection Time: 12/14/18 10:36 PM  Result Value Ref Range   Alcohol, Ethyl (B)  <10 mg/dL    PATIENT IDENTIFICATION ERROR. PLEASE DISREGARD RESULTS. ACCOUNT WILL BE CREDITED.    Comment: (NOTE) Lowest detectable limit for serum alcohol is 10 mg/dL. For medical purposes only. Performed at [facility], [address] [place], [place] [address] CORRECTED ON 01/23 AT 1035: PREVIOUSLY REPORTED AS <10  Comprehensive metabolic panel     Status:  Abnormal   Collection Time: 12/15/18 12:39 AM  Result Value Ref Range   Sodium 136 135 - 145 mmol/L   Potassium 2.9 (L) 3.5 - 5.1 mmol/L   Chloride 107 98 - 111 mmol/L   CO2 19 (L) 22 - 32 mmol/L   Glucose, Bld 89 70 - 99 mg/dL   BUN 15 6 - 20 mg/dL   Creatinine, Ser 1.61 0.44 - 1.00 mg/dL   Calcium 9.0 8.9 - 09.6 mg/dL   Total Protein 7.3 6.5 - 8.1 g/dL   Albumin 4.4 3.5 - 5.0 g/dL   AST 18 15 - 41 U/L   ALT 14 0 - 44 U/L   Alkaline Phosphatase 77 38 - 126 U/L   Total Bilirubin 0.6 0.3 - 1.2 mg/dL   GFR calc non Af Amer >60 >60 mL/min   GFR calc Af Amer >60 >60 mL/min   Anion gap 10 5 - 15    Comment: Performed at [facility], [address], [place], [place] [address]  Ethanol     Status: None   Collection Time: 12/15/18 12:39 AM  Result Value Ref Range   Alcohol, Ethyl (B) <10 <10 mg/dL    Comment: (NOTE) Lowest detectable limit for serum alcohol is 10 mg/dL. For medical purposes only. Performed at [facility], [address] [place], [place] [address]   CBC with Differential/Platelet     Status: None   Collection Time: 12/15/18  1:22 AM  Result Value Ref Range   WBC 8.6 4.0 - 10.5 K/uL   RBC 4.51 3.87 - 5.11 MIL/uL   Hemoglobin 13.5 12.0 - 15.0 g/dL   HCT 14.7 82.9 - 56.2 %   MCV 86.9 80.0 - 100.0 fL   MCH 29.9 26.0 - 34.0 pg   MCHC 34.4 30.0 - 36.0 g/dL   RDW 13.0 86.5 - 78.4 %   Platelets 200 150 - 400 K/uL   nRBC 0.0 0.0 - 0.2 %   Neutrophils Relative % 45 %   Neutro Abs 3.6 1.7 - 7.7 K/uL   Lymphocytes Relative 41 %   Lymphs Abs 3.2 0.7 - 4.0 K/uL   Monocytes Relative 9 %   Monocytes Absolute 0.7 0.1 - 1.0 K/uL   Eosinophils Relative 3 %   Eosinophils Absolute 0.2 0.0 - 0.5 K/uL   Basophils Relative 1 %   Basophils Absolute 0.1 0.0 - 0.1 K/uL   Immature Granulocytes 1 %   Abs Immature Granulocytes 0.04 0.00 - 0.07 K/uL    Comment: Performed at [facility], [address], [place], [place] [address]    Blood Alcohol level:  Lab Results  Component  Value Date   [facility] <10 12/15/2018   [facility]  12/14/2018    PATIENT IDENTIFICATION ERROR. PLEASE DISREGARD RESULTS. ACCOUNT WILL BE CREDITED.    Metabolic Disorder Labs: No results found for: HGBA1C, MPG No results found for: PROLACTIN No results found for: CHOL, TRIG, HDL, CHOLHDL, VLDL, LDLCALC  Physical Findings: AIMS: Facial and Oral Movements Muscles of Facial Expression: None, normal Lips and Perioral Area: None, normal Jaw: None, normal Tongue: None, normal,Extremity Movements Upper (arms, wrists, hands, fingers): None, normal Lower (legs, knees, ankles, toes): None, normal, Trunk Movements Neck, shoulders, hips: None, normal, Overall Severity Severity of abnormal movements (highest score from questions above): None, normal Incapacitation due to abnormal movements: None, normal Patient's awareness of abnormal movements (rate only patient's report): No Awareness, Dental Status Current problems with teeth  and/or dentures?: No Does patient usually wear dentures?: No  CIWA:  CIWA-Ar Total: 12 COWS:  COWS Total Score: 10  Musculoskeletal: Strength & Muscle Tone: within normal limits Gait & Station: normal Patient leans: N/A  Psychiatric Specialty Exam: Physical Exam  ROS  Blood pressure (!) 118/92, pulse (!) 105, temperature 99.1 F (37.3 C), temperature source Oral, resp. rate 20, height 5\' 2"  (1.575 m), weight 71.1 kg, last menstrual period 11/30/2018.Body mass index is 28.67 kg/m.  General Appearance: Casual  Eye Contact:  Good  Speech:  Clear and Coherent  Volume:  Normal  Mood:  Dysphoric  Affect:  Constricted  Thought Process:  Coherent  Orientation:  Full (Time, Place, and Person)  Thought Content:  Logical  Suicidal Thoughts:  No  Homicidal Thoughts:  No  Memory:  Immediate;   Fair  Judgement:  Fair  Insight:  Fair  Psychomotor Activity:  Normal  Concentration:  Concentration: Good  Recall:  Good  Fund of Knowledge:  Good  Language:  Good  Akathisia:   Negative  Handed:  Right  AIMS (if indicated):     Assets:  Communication Skills Desire for Improvement Leisure Time Resilience Transportation  ADL's:  Intact  Cognition:  WNL  Sleep:  Number of Hours: 6.75     Treatment Plan Summary: Daily contact with patient to assess and evaluate symptoms and progress in treatment, Medication management and Plan Continue current detox measures continue to monitor for withdrawal continue to treat  depression had no change in precautions or meds today  Harvey Matlack, MD 12/16/2018, 9:27 AM

## 2018-12-16 NOTE — Progress Notes (Signed)
Pt attended AA group this evening.  

## 2018-12-16 NOTE — Progress Notes (Signed)
Recreation Therapy Notes  Date:  1.24.20 Time: 0930 Location: [address] Dayroom  Group Topic: Stress Management  Goal Area(s) Addresses:  Patient will identify positive stress management techniques. Patient will identify benefits of using stress management post d/c.  Intervention: Stress Management  Activity :  Meditation.  LRT played a meditation that dealt with having choice.  Patients were to listen to the meditation to follow along and engage.  Education:  Stress Management, Discharge Planning.   Education Outcome: Acknowledges Education  Clinical Observations/Feedback:  Pt did not attend group.     Moreen Piggott, LRT/CTRS         Adena Sima A 12/16/2018 11:07 AM 

## 2018-12-16 NOTE — Tx Team (Signed)
Interdisciplinary Treatment and Diagnostic Plan Update  12/16/2018 Time of Session: 0830AM Alexandra Ball MRN: [id number]  Principal Diagnosis: MDD Secondary Diagnoses: Active Problems:   MDD (major depressive disorder), recurrent episode, severe (HCC)   Current Medications:  Current Facility-Administered Medications  Medication Dose Route Frequency Provider Last Rate Last Dose  . acetaminophen (TYLENOL) tablet 650 mg  650 mg Oral Q6H PRN Kerry HoughSimon, Spencer E, PA-C   650 mg at 12/16/18 0630  . alum & mag hydroxide-simeth (MAALOX/MYLANTA) 200-200-20 MG/5ML suspension 30 mL  30 mL Oral Q4H PRN Donell SievertSimon, Spencer E, PA-C      . chlordiazePOXIDE (LIBRIUM) capsule 25 mg  25 mg Oral Q6H PRN Donell SievertSimon, Spencer E, PA-C   25 mg at 12/15/18 2241  . chlordiazePOXIDE (LIBRIUM) capsule 25 mg  25 mg Oral QID Kerry HoughSimon, Spencer E, PA-C   25 mg at 12/16/18 [id number]   Followed by  . [START ON 12/17/2018] chlordiazePOXIDE (LIBRIUM) capsule 25 mg  25 mg Oral TID Kerry HoughSimon, Spencer E, PA-C       Followed by  . [START ON 12/18/2018] chlordiazePOXIDE (LIBRIUM) capsule 25 mg  25 mg Oral BH-qamhs Simon, Spencer E, PA-C       Followed by  . [START ON 12/20/2018] chlordiazePOXIDE (LIBRIUM) capsule 25 mg  25 mg Oral Daily Simon, Spencer E, PA-C      . feeding supplement (ENSURE ENLIVE) (ENSURE ENLIVE) liquid 237 mL  237 mL Oral BID BM Donell SievertSimon, Spencer E, PA-C   237 mL at 12/15/18 0944  . hydrOXYzine (ATARAX/VISTARIL) tablet 50 mg  50 mg Oral Q6H PRN Kerry HoughSimon, Spencer E, PA-C   50 mg at 12/15/18 2145  . loperamide (IMODIUM) capsule 2-4 mg  2-4 mg Oral PRN Donell SievertSimon, Spencer E, PA-C      . magnesium hydroxide (MILK OF MAGNESIA) suspension 30 mL  30 mL Oral Daily PRN Kerry HoughSimon, Spencer E, PA-C      . methocarbamol (ROBAXIN) tablet 750 mg  750 mg Oral TID Malvin JohnsFarah, Brian, MD   750 mg at 12/16/18 0824  . mirtazapine (REMERON SOL-TAB) disintegrating tablet 30 mg  30 mg Oral QHS Malvin JohnsFarah, Brian, MD   30 mg at 12/15/18 2144  . multivitamin with minerals tablet 1  tablet  1 tablet Oral Daily Kerry HoughSimon, Spencer E, PA-C   1 tablet at 12/16/18 [id number]  . nicotine (NICODERM CQ - dosed in mg/24 hours) patch 21 mg  21 mg Transdermal Daily Malvin JohnsFarah, Brian, MD   21 mg at 12/16/18 [id number]  . ondansetron (ZOFRAN-ODT) disintegrating tablet 4 mg  4 mg Oral Q6H PRN Kerry HoughSimon, Spencer E, PA-C   4 mg at 12/16/18 [id number]  . phenytoin (DILANTIN) ER capsule 300 mg  300 mg Oral QHS Malvin JohnsFarah, Brian, MD   300 mg at 12/15/18 2144  . thiamine (B-1) injection 100 mg  100 mg Intramuscular Once Donell SievertSimon, Spencer E, PA-C      . thiamine (VITAMIN B-1) tablet 100 mg  100 mg Oral Daily Donell SievertSimon, Spencer E, PA-C   100 mg at 12/16/18 0825   PTA Medications: No medications prior to admission.    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   Physician Treatment Plan for Primary Diagnosis: MDD Long Term Goal(s): Improvement in symptoms so as ready for discharge Improvement in symptoms so as ready for discharge   Short  Term Goals: Ability to maintain clinical measurements within normal limits will improve Compliance with prescribed medications will improve Compliance with prescribed medications will improve Ability to identify triggers associated with substance abuse/mental health issues will improve  Medication Management: Evaluate patient's response, side effects, and tolerance of medication regimen.  Therapeutic Interventions: 1 to 1 sessions, Unit Group sessions and Medication administration.  Evaluation of Outcomes: Progressing  Physician Treatment Plan for Secondary Diagnosis: Active Problems:   MDD (major depressive disorder), recurrent episode, severe (HCC)  Long Term Goal(s): Improvement in symptoms so as ready for  discharge Improvement in symptoms so as ready for discharge   Short Term Goals: Ability to maintain clinical measurements within normal limits will improve Compliance with prescribed medications will improve Compliance with prescribed medications will improve Ability to identify triggers associated with substance abuse/mental health issues will improve     Medication Management: Evaluate patient's response, side effects, and tolerance of medication regimen.  Therapeutic Interventions: 1 to 1 sessions, Unit Group sessions and Medication administration.  Evaluation of Outcomes: Progressing   RN Treatment Plan for Primary Diagnosis: MDD Long Term Goal(s): Knowledge of disease and therapeutic regimen to maintain health will improve  Short Term Goals: Ability to verbalize frustration and anger appropriately will improve, Ability to demonstrate self-control, Ability to participate in decision making will improve and Ability to identify and develop effective coping behaviors will improve  Medication Management: RN will administer medications as ordered by provider, will assess and evaluate patient's response and provide education to patient for prescribed medication. RN will report any adverse and/or side effects to prescribing provider.  Therapeutic Interventions: 1 on 1 counseling sessions, Psychoeducation, Medication administration, Evaluate responses to treatment, Monitor vital signs and CBGs as ordered, Perform/monitor CIWA, COWS, AIMS and Fall Risk screenings as ordered, Perform wound care treatments as ordered.  Evaluation of Outcomes: Progressing   LCSW Treatment Plan for Primary Diagnosis: MDD Long Term Goal(s): Safe transition to appropriate next level of care at discharge, Engage patient in therapeutic group addressing interpersonal concerns.  Short Term Goals: Engage patient in aftercare planning with referrals and resources, Increase social support, Increase emotional regulation,  Identify triggers associated with mental health/substance abuse issues and Increase skills for wellness and recovery  Therapeutic Interventions: Assess for all discharge needs, 1 to 1 time with Social worker, Explore available resources and support systems, Assess for adequacy in community support network, Educate family and significant other(s) on suicide prevention, Complete Psychosocial Assessment, Interpersonal group therapy.  Evaluation of Outcomes: Progressing   Progress in Treatment: Attending groups: Yes. Participating in groups: Yes. Taking medication as prescribed: Yes. Toleration medication: Yes. Family/Significant other contact made: SPE completed with pt, pt denied to consent with collateral contact Patient understands diagnosis: Yes. Discussing patient identified problems/goals with staff: Yes. Medical problems stabilized or resolved: Yes. Denies suicidal/homicidal ideation: Yes. Issues/concerns per patient self-inventory: No. Other:   New problem(s) identified: No, Describe:  none  New Short Term/Long Term Goal(s): detox, medication management for mood stabilization; elimination of SI thoughts; development of comprehensive mental wellness/sobriety plan.    Patient Goals:  "To get into a long-term treatment facility"  Discharge Plan or Barriers: [facility] pamphlet, Mobile Crisis information, and AA/NA information provided to patient for additional community support and resources. [facility] and [facility] referrals made for residential, [facility] for outpatient services. CSW assessing for appropriate referrals.   Reason for Continuation of Hospitalization: Anxiety Depression Medication stabilization Withdrawal symptoms  Estimated Length of Stay: Monday 12/19/18  Attendees: Patient: Cala Bradford  Bene 12/16/2018 9:23 AM  Physician: Dr. Jeannine KittenFarah, MD 12/16/2018 9:23 AM  Nursing: Huntley DecSara RN; Patrice RN 12/16/2018 9:23 AM  RN Care Manager:x 12/16/2018 9:23 AM  Social Worker:  Corrie MckusickHeather Prairie Stenberg LCSW 12/16/2018 9:23 AM  Recreational Therapist: x 12/16/2018 9:23 AM  Other: Marciano SequinJanet Sykes NP 12/16/2018 9:23 AM  Other:  12/16/2018 9:23 AM  Other: 12/16/2018 9:23 AM    Scribe for Treatment Team: Rona RavensHeather S Lenville Hibberd, LCSW 12/16/2018 9:23 AM

## 2018-12-16 NOTE — [facility] Group Notes (Signed)
LCSW Group Therapy Note  12/16/2018 2:07 PM  Type of Therapy and Topic:  Group Therapy:  Feelings around Relapse and Recovery  Participation Level:  Active   Description of Group:    Patients in this group will discuss emotions they experience before and after a relapse. They will process how experiencing these feelings, or avoidance of experiencing them, relates to having a relapse. Facilitator will guide patients to explore emotions they have related to recovery. Patients will be encouraged to process which emotions are more powerful. They will be guided to discuss the emotional reaction significant others in their lives may have to their relapse or recovery. Patients will be assisted in exploring ways to respond to the emotions of others without this contributing to a relapse.  Therapeutic Goals: 1. Patient will identify two or more emotions that lead to a relapse for them 2. Patient will identify two emotions that result when they relapse 3. Patient will identify two emotions related to recovery 4. Patient will demonstrate ability to communicate their needs through discussion and/or role plays   Summary of Patient Progress:  Pt reported that she typically relapses when she is "not working the program". Pt reports that when she stops going to meetings and actively working towards recovery, she tends to relapse. Pt reported plans to avoid her hometown as her town contributes to her relapsing and identified AA/NA groups as supports to assist with her recovery.   Therapeutic Modalities:   Cognitive Behavioral Therapy Solution-Focused Therapy Assertiveness Training Relapse Prevention Therapy   Iris Pertlivia Briani Maul, MSW, LCSW Clinical Social Work 12/16/2018 2:07 PM

## 2018-12-16 NOTE — Progress Notes (Signed)
Patient ID: Alexandra Ball, female   DOB: 04-16-1981, 38 y.o.   MRN: [id number]  Pt currently presents with a flat affect and cooperative behavior. Pt reports to writer that their goal is to "finding a rehab." Reports ongoing cravings for heroin. Presents with signs and symptoms of withdrawal including headache, fatigue, flu-like symptoms, and agitation. Pt reports good sleep with current medication regimen.   Pt provided with medications per providers orders. Pt's labs and vitals were monitored throughout the night. Pt supported emotionally and encouraged to express concerns and questions. Pt educated on medications and opiate withdrawal detox protocol.   Pt's safety ensured with 15 minute and environmental checks. Pt currently denies SI/HI and A/V hallucinations. Pt verbally agrees to seek staff if SI/HI or A/VH occurs and to consult with staff before acting on any harmful thoughts. Will continue POC.

## 2018-12-17 NOTE — Progress Notes (Signed)
Patient attended the evening A.A. meeting without any difficulty.

## 2018-12-17 NOTE — Progress Notes (Signed)
D. Pt pleasant on approach, no complaints voiced at this time.  Pt was positive for evening AA group, observed engaged in appropriate interaction with peers on the unit.  Pt denies SI/HI/AVH at this time.  A.  Support and encouragement offered, medication given as ordered  R.  Pt remains safe on the unit, will continue to monitor.   
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
## 2018-12-17 NOTE — Progress Notes (Signed)
D. Pt presents with a flat affect /depressed mood- calm and cooperative- but somewhat isolative today complaining of moderate withdrawal symptoms. Per pt's self inventory, pt rates her depression, hopelessness and anxiety a 2/2/3, respectively. Pt currently denies SI/HI and AV hallucinations A. Labs and vitals monitored. Pt compliant with medications. Pt supported emotionally and encouraged to express concerns and ask questions.   R. Pt remains safe with 15 minute checks. Will continue POC.

## 2018-12-18 NOTE — Progress Notes (Addendum)
[facility]  12/18/2018 3:30 PM Alexandra Ball  MRN:  [id number] Subjective:    Patient is found in her room, lying down. She did not attend group and is not willing to talk to this Clinical research associatewriter today. She denies suicidal/homicidal ideation and denies hallucinations. She stated she is irritable today and just does not want to interact with anyone. She stated nobody said or did anything to make her irritable, she just is.  Based on our conversation yesterday it is possible she is concerned about impending discharge and worrying about going to jail if she has no rehab or half-way house to go to. This Clinical research associatewriter assured her that social work would do everything possible to meet her needs. She is able to contract for safety on the unit. Will continue to monitor and offer encouragement.   No acute psychosis no involuntary movements. No seizures.  She is on probation and stated if she has no rehab or half-way house to go to on discharge she will go to jail. She reports her sleep and appetite are good.    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 Bipolar disorder (HCC)   . Carpal tunnel syndrome   . Depression   . Noncompliance with medication regimen   . Seizures (HCC)    mild one 6months ago, off meds for pregnancy. Was on lamictil and dilatin. Last seizure 1 year ago as of 12/14/18  . Substance abuse (HCC)    Multiple substances    Past Surgical History:  Procedure Laterality Date  . CHOLECYSTECTOMY OPEN  2006   during second pregnancy  . OVARIAN CYST REMOVAL    . TUBAL LIGATION  07/01/2011   Procedure: POST PARTUM TUBAL LIGATION;  Surgeon: Scheryl DarterJames Arnold, MD;  Location: [facility];  Service: Gynecology;   Laterality: Bilateral;  With Filshie Clips    Family History: History reviewed. No pertinent family history.  Social History:  Social History   Substance and Sexual Activity  Alcohol Use No   Comment: previos hx of drug abuse admitted to behavioral health in 2010; states occasionally on 03/06/12     Social History   Substance and Sexual Activity  Drug Use Yes  . Types: Other-see comments, Marijuana   Comment: heroin, meth, marijuana    Social History   Socioeconomic History  . Marital status: Married    Spouse name: Not on file  . Number of children: Not on file  . Years of education: Not on file  . Highest education level: Not on file  Occupational History  . Not on file  Social Needs  . Financial resource strain: Not on file  . Food insecurity:    Worry: Not on file    Inability: Not on file  . Transportation needs:    Medical: Not on file    Non-medical: Not on file  Tobacco Use  . Smoking status: Current Every Day Smoker    Packs/day: 1.00    Years: 10.00    Pack years: 10.00    Types: Cigarettes  . Smokeless tobacco: Never Used  Substance and Sexual Activity  . Alcohol use: No    Comment: previos hx of drug abuse admitted to behavioral health in 2010; states occasionally on 03/06/12  . Drug use: Yes  Types: Other-see comments, Marijuana    Comment: heroin, meth, marijuana  . Sexual activity: Yes    Birth control/protection: Surgical    Comment: denies   Lifestyle  . Physical activity:    Days per week: Not on file    Minutes per session: Not on file  . Stress: Not on file  Relationships  . Social connections:    Talks on phone: Not on file    Gets together: Not on file    Attends religious service: Not on file    Active member of club or organization: Not on file    Attends meetings of clubs or organizations: Not on file    Relationship status: Not on file  Other Topics Concern  . Not on file  Social History Narrative  . Not on file    Additional Social History:    Pain Medications: None Prescriptions: None Over the Counter: None History of alcohol / drug use?: Yes Longest period of sobriety (when/how long): 9 months clean in 2018 Negative Consequences of Use: Personal relationships, Legal Withdrawal Symptoms: Sweats, Weakness, Tremors, Fever / Chills, Cramps, Nausea / Vomiting, Patient aware of relationship between substance abuse and physical/medical complications, Diarrhea, Seizures Onset of Seizures: has epilepsy.  Will withdrawl seizures Date of most recent seizure: A month ago. Name of Substance 1: Heroin (IV) 1 - Age of First Use: 38 years of age 41 - Amount (size/oz): 1/2 to 1 gram daily 1 - Frequency: Daily 1 - Duration: on-going 1 - Last Use / Amount: Couple of days ago Name of Substance 2: Methamphetamine (IV) 2 - Age of First Use: 38 years of age 63 - Amount (size/oz): 1/16 to a gram 2 - Frequency: Once every 3 months or so 2 - Duration: Uses when she cannot get heroin 2 - Last Use / Amount: 01/22    Sleep: Fair  Appetite:  Fair  Current Medications: Current Facility-Administered Medications  Medication Dose Route Frequency Provider Last Rate Last Dose  . acetaminophen (TYLENOL) tablet 650 mg  650 mg Oral Q6H PRN Kerry Hough, PA-C   650 mg at 12/17/18 1659  . alum & mag hydroxide-simeth (MAALOX/MYLANTA) 200-200-20 MG/5ML suspension 30 mL  30 mL Oral Q4H PRN Donell Sievert E, PA-C      . chlordiazePOXIDE (LIBRIUM) capsule 25 mg  25 mg Oral Q6H PRN Donell Sievert E, PA-C   25 mg at 12/15/18 2241  . chlordiazePOXIDE (LIBRIUM) capsule 25 mg  25 mg Oral BH-qamhs Simon, Spencer E, PA-C       Followed by  . [START ON 12/20/2018] chlordiazePOXIDE (LIBRIUM) capsule 25 mg  25 mg Oral Daily Simon, Spencer E, PA-C      . feeding supplement (ENSURE ENLIVE) (ENSURE ENLIVE) liquid 237 mL  237 mL Oral BID BM Kerry Hough, PA-C   Stopped at 12/17/18 1834  . hydrOXYzine (ATARAX/VISTARIL) tablet 50 mg  50  mg Oral Q6H PRN Donell Sievert E, PA-C   50 mg at 12/18/18 1522  . loperamide (IMODIUM) capsule 2-4 mg  2-4 mg Oral PRN Donell Sievert E, PA-C   2 mg at 12/17/18 1128  . magnesium hydroxide (MILK OF MAGNESIA) suspension 30 mL  30 mL Oral Daily PRN Donell Sievert E, PA-C      . methocarbamol (ROBAXIN) tablet 750 mg  750 mg Oral TID Malvin Johns, MD   750 mg at 12/18/18 1159  . mirtazapine (REMERON SOL-TAB) disintegrating tablet 30 mg  30 mg Oral QHS Malvin Johns, MD  30 mg at 12/17/18 2135  . multivitamin with minerals tablet 1 tablet  1 tablet Oral Daily Kerry Hough, PA-C   1 tablet at 12/18/18 0830  . nicotine (NICODERM CQ - dosed in mg/24 hours) patch 21 mg  21 mg Transdermal Daily Malvin Johns, MD   21 mg at 12/18/18 1610  . ondansetron (ZOFRAN-ODT) disintegrating tablet 4 mg  4 mg Oral Q6H PRN Kerry Hough, PA-C   4 mg at 12/16/18 9604  . phenytoin (DILANTIN) ER capsule 300 mg  300 mg Oral QHS Malvin Johns, MD   300 mg at 12/17/18 2135  . thiamine (B-1) injection 100 mg  100 mg Intramuscular Once Donell Sievert E, PA-C      . thiamine (VITAMIN B-1) tablet 100 mg  100 mg Oral Daily Donell Sievert E, PA-C   100 mg at 12/18/18 5409    Lab Results:  No results found for this or any previous visit (from the past 48 hour(s)).  Blood Alcohol level:  Lab Results  Component Value Date   [facility] <10 12/15/2018   [facility]  12/14/2018    PATIENT IDENTIFICATION ERROR. PLEASE DISREGARD RESULTS. ACCOUNT WILL BE CREDITED.    Metabolic Disorder Labs: No results found for: HGBA1C, MPG No results found for: PROLACTIN No results found for: CHOL, TRIG, HDL, CHOLHDL, VLDL, LDLCALC  Physical Findings: AIMS: Facial and Oral Movements Muscles of Facial Expression: None, normal Lips and Perioral Area: None, normal Jaw: None, normal Tongue: None, normal,Extremity Movements Upper (arms, wrists, hands, fingers): None, normal Lower (legs, knees, ankles, toes): None, normal, Trunk Movements Neck,  shoulders, hips: None, normal, Overall Severity Severity of abnormal movements (highest score from questions above): None, normal Incapacitation due to abnormal movements: None, normal Patient's awareness of abnormal movements (rate only patient's report): No Awareness, Dental Status Current problems with teeth and/or dentures?: No Does patient usually wear dentures?: No  CIWA:  CIWA-Ar Total: 2 COWS:  COWS Total Score: 7  Musculoskeletal: Strength & Muscle Tone: within normal limits Gait & Station: normal Patient leans: N/A  Psychiatric Specialty Exam: Physical Exam  Constitutional: She is oriented to person, place, and time. She appears well-developed and well-nourished.  HENT:  Head: Normocephalic.  Neurological: She is alert and oriented to person, place, and time.    ROS  Blood pressure 117/77, pulse 92, temperature 98 F (36.7 C), resp. rate 20, height 5\' 2"  (1.575 m), weight 71.1 kg, last menstrual period 11/30/2018, SpO2 100 %.Body mass index is 28.67 kg/m.  General Appearance: Casual  Eye Contact:  Good  Speech:  Clear and Coherent  Volume:  Normal  Mood:  Dysphoric  Affect:  Constricted  Thought Process:  Coherent  Orientation:  Full (Time, Place, and Person)  Thought Content:  Logical  Suicidal Thoughts:  No  Homicidal Thoughts:  No  Memory:  Immediate;   Fair  Judgement:  Fair  Insight:  Fair  Psychomotor Activity:  Normal  Concentration:  Concentration: Good  Recall:  Good  Fund of Knowledge:  Good  Language:  Good  Akathisia:  Negative  Handed:  Right  AIMS (if indicated):     Assets:  Communication Skills Desire for Improvement Leisure Time Resilience Transportation  ADL's:  Intact  Cognition:  WNL  Sleep:  Number of Hours: 6.75     Treatment Plan Summary: Daily contact with patient to assess and evaluate symptoms and progress in treatment, Medication management and Plan Continue current detox measures continue to monitor for withdrawal  continue  to treat  depression had no change in precautions or meds today  Attend group therapy Participate in therpeuutic milieu  Discharge planning ongoing Continue Dilantin ER 300 mg at bedtime for seizures Continue Remeron Sol-Tab 30 mg at bedtime for sleep Continue to offer Ensure Continue Librium detox protocol   Laveda AbbeLaurie Britton Parks, NP 12/18/2018, 3:30 PM   Agree with NP progress note

## 2018-12-18 NOTE — Progress Notes (Signed)
Patient did attend the evening speaker AA meeting.  

## 2018-12-18 NOTE — Progress Notes (Signed)
D.  Pt pleasant on approach, no complaints voiced.  Pt was positive for evening AA group, observed engaged in appropriate interaction with peers on the unit.  Pt denies SI/HI/AVH at this time.  A.  Support and encouragement offered, medication given as ordered  R.  Pt remains safe on the unit, will continue to monitor.   

## 2018-12-18 NOTE — Plan of Care (Signed)
  Problem: Activity: Goal: Interest or engagement in leisure activities will improve Outcome: Progressing  D: Pt alert and oriented on the unit. Pt engaging with RN staff and other pts. Pt denies SI/HI, A/VH. Pt was irritable this morning but later became more pleasant and participated during unit groups and activities.  Pt rated her depression, feelings of hopelessness, and anxiety all a 1, on a scale of 0 to 10, with 10 being the worst. Pt's goal for today is "getting in rehab." Pt is cooperative on the unit.  A: Education, support and encouragement provided, q15 minute safety checks remain in effect. Medications administered per MD orders.  R: No reactions/side effects to medicine noted. Pt denies any concerns at this time, and verbally contracts for safety. Pt ambulating on the unit with no issues. Pt remains safe on and off the unit.

## 2018-12-19 MED ORDER — MIRTAZAPINE 30 MG PO TBDP: 30.0000 mg | ORAL_TABLET | Freq: Every day | ORAL | 1 refills | Status: AC

## 2018-12-19 MED ORDER — PHENYTOIN SODIUM EXTENDED 300 MG PO CAPS: 300.0000 mg | ORAL_CAPSULE | Freq: Every day | ORAL | 1 refills | Status: AC

## 2018-12-19 NOTE — Discharge Summary (Signed)
Physician Discharge Summary Note  Patient:  Alexandra BartersKimberly J Kneece is an 38 y.o., female  MRN:  [id number]  DOB:  October 10, 1981  Patient phone:  [phone] (home)   Patient address:   [address] [place] [id number],   Time spent with patient: Greater than 30 minutes  Date of Admission:  12/15/2018  Date of Discharge: 12/19/18  Reason for Admission: Reports of psychosis, homelessness, depression, heroin dependence & worsening use of other drugs   Principal Problem: <principal problem not specified>  Diagnoses: Patient Active Problem List   Diagnosis Date Noted  . MDD (major depressive disorder), recurrent episode, severe (HCC) [F33.2] 12/15/2018  . Severe recurrent major depression without psychotic features (HCC) [F33.2] 11/27/2017  . Substance use disorder [F19.90] 11/27/2017  . S/P tubal ligation [Z98.51] 07/02/2011  . Postpartum care following vaginal delivery [Z39.2] 06/30/2011   Past Psychiatric History: Bipolar disorder, Major depression.  Past Medical History:  Past Medical History:  Diagnosis Date  . Bipolar disorder (HCC)   . Carpal tunnel syndrome   . Depression   . Noncompliance with medication regimen   . Seizures (HCC)    mild one 6months ago, off meds for pregnancy. Was on lamictil and dilatin. Last seizure 1 year ago as of 12/14/18  . Substance abuse (HCC)    Multiple substances    Past Surgical History:  Procedure Laterality Date  . CHOLECYSTECTOMY OPEN  2006   during second pregnancy  . OVARIAN CYST REMOVAL    . TUBAL LIGATION  07/01/2011   Procedure: POST PARTUM TUBAL LIGATION;  Surgeon: Scheryl DarterJames Arnold, MD;  Location: [facility];  Service: Gynecology;  Laterality: Bilateral;  With Filshie Clips    Family History: History reviewed. No pertinent family history.  Family Psychiatric  History: See H&P  Social History:  Social History   Substance and Sexual Activity  Alcohol Use No   Comment: previos hx of drug abuse admitted to behavioral health  in 2010; states occasionally on 03/06/12     Social History   Substance and Sexual Activity  Drug Use Yes  . Types: Other-see comments, Marijuana   Comment: heroin, meth, marijuana    Social History   Socioeconomic History  . Marital status: Married    Spouse name: Not on file  . Number of children: Not on file  . Years of education: Not on file  . Highest education level: Not on file  Occupational History  . Not on file  Social Needs  . Financial resource strain: Not on file  . Food insecurity:    Worry: Not on file    Inability: Not on file  . Transportation needs:    Medical: Not on file    Non-medical: Not on file  Tobacco Use  . Smoking status: Current Every Day Smoker    Packs/day: 1.00    Years: 10.00    Pack years: 10.00    Types: Cigarettes  . Smokeless tobacco: Never Used  Substance and Sexual Activity  . Alcohol use: No    Comment: previos hx of drug abuse admitted to behavioral health in 2010; states occasionally on 03/06/12  . Drug use: Yes    Types: Other-see comments, Marijuana    Comment: heroin, meth, marijuana  . Sexual activity: Yes    Birth control/protection: Surgical    Comment: denies   Lifestyle  . Physical activity:    Days per week: Not on file    Minutes per session: Not on file  . Stress:  Not on file  Relationships  . Social connections:    Talks on phone: Not on file    Gets together: Not on file    Attends religious service: Not on file    Active member of club or organization: Not on file    Attends meetings of clubs or organizations: Not on file    Relationship status: Not on file  Other Topics Concern  . Not on file  Social History Narrative  . Not on file   Hospital Course: (Per Md's admission evaluation): Patient is known to the service, last here approximately 1 year ago in January 2019 at that point she was diagnosed with depression recurrent severe without psychosis, substance use disorder and history of seizure  disorder. 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  She acknowledged multiple symptoms of depression and had follow-up arranged with [facility]. This presentation involved several problems, reports of psychosis, homelessness, depression and a drug screen that was positive for amphetamines, benzodiazepines and cannabis, she states she was dependent on heroin she and her husband went on a long distance trip by truck and she felt they have packed enough heroin for the journey but it turned out they went into withdrawal and she began abusing other compounds instead.   Melitta was admitted to the [facility] hospital with her UDS test reports positive for Amphetamine, Benzodiazepine & THC. She was also complaining of mood instability, high anxiety levels, psychosis, heroin dependence & problems with homelessness. Her UDS on arrival was (+) for Amphetamine, Benzodiazepine & THC. After evaluation of her presenting symptoms, she was recommended for drug detoxification as well as mood stabilization treatments. Her detoxification treatments were achieved using Librium detox protocols.  Besides the detoxification treatments, Isabela also received mood stabilization treatments. She was medicated, stabilized & discharged on the medications as listed below. She was resumed on all her pertinent home medications for her other pre-existing medical issues that she presented. She tolerated her treatment regimen without any significant adverse effects or reactions reported. She was also enrolled & participated in the group counseling sessions & AA/NA meetings being offered & held on this unit. She learned coping skills that should help her cope better after discharge to maintain mood stability & sobriety.  Lenita has completed detox treatment and her mood is also stable.  This is evidenced by her reports of improved mood, absence of suicidal ideations & or substance withdrawals symptoms. She is currently being discharged to continue mental health care on an outpatient basis as noted below. Upon discharge Jhene was in much improved condition than upon admission. Her symptoms were reported as significantly decreased or resolved completely. She currently denies any SI/HI, AVH, delusional thoughts & or paranoia. She was motivated to continue taking medications with a goal of continued improvement in mental health. Jamielee was provided with all the pertinent information required to make her follow-up appointment without problems. She was able to engage in safety planning including plan to return to [facility] or contact emergency services if she feels unable to maintain herown safety or the safety of others. Pt had no further questions, comments or concerns. He left [facility] with all personal belongings in no apparent distress.[facility] belongings in no apparent distress.  Physical Findings: AIMS: Facial and Oral Movements Muscles of Facial Expression: None, normal Lips and Perioral  Area: None, normal Jaw: None, normal Tongue: None, normal,Extremity Movements Upper (arms, wrists, hands, fingers): None, normal Lower (legs, knees, ankles, toes): None, normal, Trunk Movements Neck, shoulders, hips: None, normal, Overall Severity Severity of abnormal movements (highest score from questions above): None, normal Incapacitation due to abnormal movements: None, normal Patient's awareness of abnormal movements (rate only patient's report): No Awareness, Dental Status Current problems with teeth and/or dentures?: No Does patient usually wear dentures?: No  CIWA:  CIWA-Ar Total: 4 COWS:  COWS Total Score: 7  Musculoskeletal: Strength & Muscle Tone: within normal limits Gait & Station: normal Patient leans: N/A  Psychiatric Specialty Exam: Physical Exam   Nursing note and vitals reviewed. Constitutional: She is oriented to person, place, and time. She appears well-developed and well-nourished.  HENT:  Head: Normocephalic.  Eyes: Pupils are equal, round, and reactive to light.  Neck: Normal range of motion.  Cardiovascular: Normal rate.  Respiratory: Effort normal.  GI: Soft.  Genitourinary:    Genitourinary Comments: Deferred   Musculoskeletal: Normal range of motion.  Neurological: She is alert and oriented to person, place, and time.  Skin: Skin is warm.    Review of Systems  Constitutional: Negative.   HENT: Negative.   Eyes: Negative.   Respiratory: Negative.  Negative for cough and shortness of breath.   Cardiovascular: Negative.  Negative for chest pain and palpitations.  Gastrointestinal: Negative.  Negative for abdominal pain, heartburn, nausea and vomiting.  Genitourinary: Negative.   Musculoskeletal: Negative.   Skin: Negative.   Neurological: Negative.  Negative for dizziness and headaches.  Endo/Heme/Allergies: Negative.   Psychiatric/Behavioral: Positive for depression (Stable) and substance abuse (Hx. Amphetamine, Benzodiazepine & THC use disorders). Negative for hallucinations, memory loss and suicidal ideas. The patient is nervous/anxious and has insomnia (Stable).     Blood pressure 121/87, pulse 85, temperature 98 F (36.7 C), resp. rate 20, height 5\' 2"  (1.575 m), weight 71.1 kg, last menstrual period 11/30/2018, SpO2 100 %.Body mass index is 28.67 kg/m.  See Md's discharge SRA   Have you used any form of tobacco in the last 30 days? (Cigarettes, Smokeless Tobacco, Cigars, and/or Pipes): Yes   this patient used any form of tobacco in the last 30 days? (Cigarettes, Smokeless Tobacco, Cigars, and/or Pipes) Yes, Yes, A prescription for an FDA-approved tobacco cessation medication was offered at discharge and the patient refused  Blood Alcohol level:  Lab Results  Component Value Date   [facility] 12/15/[facility]    [facility]  12/14/[facility]    PATIENT IDENTIFICATION ERROR. PLEASE DISREGARD RESULTS. ACCOUNT WILL BE CREDITED.   Metabolic Disorder Labs:  No results found for: HGBA1C, MPG No results found for: PROLACTIN No results found for: CHOL, TRIG, HDL, CHOLHDL, VLDL, LDLCALC  See Psychiatric Specialty Exam and Suicide Risk Assessment completed by Attending Physician prior to discharge.  Discharge destination:  Home  Is patient on multiple antipsychotic therapies at discharge:  No   Has Patient had three or more failed trials of antipsychotic monotherapy by history:  No  Recommended Plan for Multiple Antipsychotic Therapies: NA  Allergies as of 12/19/2018      Reactions   Ultram [tramadol Hcl] Hives, Swelling   Bodily swelling      Medication List    TAKE these medications     Indication  mirtazapine 30 MG disintegrating tablet Commonly known as:  REMERON SOL-TAB Take 1 tablet (30 mg total) by mouth at bedtime.  Indication:  Major Depressive Disorder   phenytoin 300 MG ER capsule  Commonly known as:  DILANTIN Take 1 capsule (300 mg total) by mouth at bedtime.  Indication:  Seizure      Follow-up Information    Services, [facility]. Go on 12/20/2018.   Why:  Hospital follow up appointment is 1/28 at 10:00a. Please bring your insurance card.  Contact information: [address] [place] [address] [phone]        [facility] Follow up.   Why:  You may follow up with your referral by calling [phone]. Contact information: [address] [place] [address] [phone]          Follow-up recommendations:  Activity:  As tolerated Diet: As recommended by your primary care doctor. Keep all scheduled follow-up appointments as recommended.  Comments:  Patient is instructed prior to discharge to: Take all medications as prescribed by his/her mental healthcare provider. Report any adverse effects and or reactions from the medicines to  his/her outpatient provider promptly. Patient has been instructed & cautioned: To not engage in alcohol and or illegal drug use while on prescription medicines. In the event of worsening symptoms, patient is instructed to call the crisis hotline, 911 and or go to the nearest ED for appropriate evaluation and treatment of symptoms. To follow-up with his/her primary care provider for your other medical issues, concerns and or health care needs.   Signed: Armandina Stammer, NP, PMHNP, FNP-BC 12/19/2018, 1:20 PM

## 2018-12-19 NOTE — Plan of Care (Signed)
Discharge note  Patient verbalizes readiness for discharge. Follow up plan explained, AVS, Transition record and SRA given. Prescriptions and teaching provided. Belongings returned and signed for. Suicide safety plan completed and signed. Patient verbalizes understanding. Patient denies SI/HI and assures this Clinical research associatewriter he will seek assistance should that change. Patient discharged to lobby where [place] transportation was waiting.  Problem: Education: Goal: Utilization of techniques to improve thought processes will improve Outcome: Adequate for Discharge Goal: Knowledge of the prescribed therapeutic regimen will improve Outcome: Adequate for Discharge   Problem: Activity: Goal: Interest or engagement in leisure activities will improve Outcome: Adequate for Discharge Goal: Imbalance in normal sleep/wake cycle will improve Outcome: Adequate for Discharge   Problem: Coping: Goal: Coping ability will improve Outcome: Adequate for Discharge Goal: Will verbalize feelings Outcome: Adequate for Discharge   Problem: Health Behavior/Discharge Planning: Goal: Ability to make decisions will improve Outcome: Adequate for Discharge Goal: Compliance with therapeutic regimen will improve Outcome: Adequate for Discharge   Problem: Role Relationship: Goal: Will demonstrate positive changes in social behaviors and relationships Outcome: Adequate for Discharge   Problem: Safety: Goal: Ability to disclose and discuss suicidal ideas will improve Outcome: Adequate for Discharge Goal: Ability to identify and utilize support systems that promote safety will improve Outcome: Adequate for Discharge   Problem: Self-Concept: Goal: Will verbalize positive feelings about self Outcome: Adequate for Discharge Goal: Level of anxiety will decrease Outcome: Adequate for Discharge   Problem: Education: Goal: Ability to make informed decisions regarding treatment will improve Outcome: Adequate for Discharge    Problem: Coping: Goal: Coping ability will improve Outcome: Adequate for Discharge   Problem: Health Behavior/Discharge Planning: Goal: Identification of resources available to assist in meeting health care needs will improve Outcome: Adequate for Discharge   Problem: Medication: Goal: Compliance with prescribed medication regimen will improve Outcome: Adequate for Discharge   Problem: Self-Concept: Goal: Ability to disclose and discuss suicidal ideas will improve Outcome: Adequate for Discharge Goal: Will verbalize positive feelings about self Outcome: Adequate for Discharge   Problem: Education: Goal: Knowledge of [facility] Education information/materials will improve Outcome: Adequate for Discharge Goal: Emotional status will improve Outcome: Adequate for Discharge Goal: Mental status will improve Outcome: Adequate for Discharge Goal: Verbalization of understanding the information provided will improve Outcome: Adequate for Discharge   Problem: Activity: Goal: Interest or engagement in activities will improve Outcome: Adequate for Discharge Goal: Sleeping patterns will improve Outcome: Adequate for Discharge   Problem: Coping: Goal: Ability to verbalize frustrations and anger appropriately will improve Outcome: Adequate for Discharge Goal: Ability to demonstrate self-control will improve Outcome: Adequate for Discharge   Problem: Health Behavior/Discharge Planning: Goal: Identification of resources available to assist in meeting health care needs will improve Outcome: Adequate for Discharge Goal: Compliance with treatment plan for underlying cause of condition will improve Outcome: Adequate for Discharge   Problem: Physical Regulation: Goal: Ability to maintain clinical measurements within normal limits will improve Outcome: Adequate for Discharge   Problem: Safety: Goal: Periods of time without injury will increase Outcome: Adequate for Discharge

## 2018-12-19 NOTE — BHH Suicide Risk Assessment (Signed)
[facility] Discharge Suicide Risk Assessment   Principal Problem: Depression, homelessness, polysubstance abuse, seizure disorder that may also be related to substance withdrawal Discharge Diagnoses: Active Problems:   MDD (major depressive disorder), recurrent episode, severe (HCC)   Total Time spent with patient: 30 minutes Alert and oriented cooperative very eager for discharge insisting she has no suicidal thoughts no cravings no tremors no withdrawal symptoms able to contract no withdrawal symptoms noted or discerned states she has housing  Mental Status Per Nursing Assessment::   On Admission:  Suicidal ideation indicated by patient  Demographic Factors:  Caucasian  Loss Factors: Decrease in vocational status  Historical Factors: NA  Risk Reduction Factors:   Religious beliefs about death  Continued Clinical Symptoms:  Alcohol/Substance Abuse/Dependencies  Cognitive Features That Contribute To Risk:  Closed-mindedness    Suicide Risk:  Minimal: No identifiable suicidal ideation.  Patients presenting with no risk factors but with morbid ruminations; may be classified as minimal risk based on the severity of the depressive symptoms  Follow-up Information    [facility] Follow up.   Contact information: [address] [place] [address] [phone]        [facility] Follow up.   Why:  Referral made: 12/15/2018 Contact information: [address] [place] [address] [phone]           Plan Of Care/Follow-up recommendations:  Activity:  full  Aero Drummonds, MD 12/19/2018, 9:13 AM

## 2018-12-19 NOTE — Progress Notes (Signed)
Patient has been accepted to a halfway house in [place], [place]. Patient arrived to [facility] from [facility], will need [place] transportation back to [facility].  Enid Cutter, LCSW-A Clinical Social Worker

## 2018-12-19 NOTE — Progress Notes (Signed)
  [facility] Adult Case Management Discharge Plan :  Will you be returning to the same living situation after discharge:  No. Going to [facility] in [place]. At discharge, do you have transportation home?: Yes,  will take [place] transportation to [facility] Do you have the ability to pay for your medications: No. No income/insurance.  Release of information consent forms completed and in the chart; letter on chart. Will need [place] transportation to [facility].  Patient to Follow up at: Follow-up Information    [facility]. Go on 12/20/2018.   Why:  Hospital follow up appointment is 1/28 at 10:00a. Please bring your insurance card.  Contact information: [address] [place] [address] [phone]        [facility] Follow up.   Why:  You may follow up with your referral by calling [phone]. Contact information: [address] [place] [address] [phone]           Next level of care provider has access to [facility] Link:no  Safety Planning and Suicide Prevention discussed: Yes,  with patient.  Have you used any form of tobacco in the last 30 days? (Cigarettes, Smokeless Tobacco, Cigars, and/or Pipes): Yes  Has patient been referred to the Quitline?: Patient refused referral  Patient has been referred for addiction treatment: Yes  Darreld Mclean, LCSWA 12/19/2018, 9:30 AM

## 2019-02-18 ENCOUNTER — Emergency Department (HOSPITAL_COMMUNITY)
Admission: EM | Admit: 2019-02-18 | Discharge: 2019-02-19 | Disposition: A | Discharge status: Home / Self Care | Payer: Medicaid Other | Attending: Emergency Medicine | Admitting: Emergency Medicine

## 2019-02-18 ENCOUNTER — Other Ambulatory Visit: Payer: Self-pay

## 2019-02-18 ENCOUNTER — Encounter (HOSPITAL_COMMUNITY): Payer: Self-pay | Admitting: Emergency Medicine

## 2019-02-18 DIAGNOSIS — M545 Low back pain, unspecified: Secondary | ICD-10-CM

## 2019-02-18 DIAGNOSIS — F17210 Nicotine dependence, cigarettes, uncomplicated: Secondary | ICD-10-CM | POA: Insufficient documentation

## 2019-02-18 DIAGNOSIS — Z79899 Other long term (current) drug therapy: Secondary | ICD-10-CM | POA: Insufficient documentation

## 2019-02-18 NOTE — ED Triage Notes (Signed)
LT flank pain and small amount blood in urine since yesterday

## 2019-02-18 NOTE — ED Provider Notes (Signed)
[facility] EMERGENCY DEPARTMENT Provider Note   CSN: [id number] Arrival date & time: 02/18/19  2339    History   Chief Complaint Chief Complaint  Patient presents with  . Flank Pain    HPI Alexandra Ball is a 38 y.o. female.     The history is provided by the patient. No language interpreter was used.  Flank Pain  This is a new problem. The problem occurs constantly. The problem has been gradually worsening. Associated symptoms include abdominal pain. Nothing aggravates the symptoms. Nothing relieves the symptoms. She has tried nothing for the symptoms. The treatment provided no relief.  Pt complains of left flank pain.  Pt reports she noticed blood in her urine.    Past Medical History:  Diagnosis Date  . Bipolar disorder (HCC)   . Carpal tunnel syndrome   . Depression   . Noncompliance with medication regimen   . Seizures (HCC)    mild one 46months ago, off meds for pregnancy. Was on lamictil and dilatin. Last seizure 1 year ago as of 12/14/18  . Substance abuse (HCC)    Multiple substances    Patient Active Problem List   Diagnosis Date Noted  . MDD (major depressive disorder), recurrent episode, severe (HCC) 12/15/2018  . Severe recurrent major depression without psychotic features (HCC) 11/27/2017  . Substance use disorder 11/27/2017  . S/P tubal ligation 07/02/2011  . Postpartum care following vaginal delivery 06/30/2011    Past Surgical History:  Procedure Laterality Date  . CHOLECYSTECTOMY OPEN  2006   during second pregnancy  . OVARIAN CYST REMOVAL    . TUBAL LIGATION  07/01/2011   Procedure: POST PARTUM TUBAL LIGATION;  Surgeon: Scheryl Darter, MD;  Location: [facility];  Service: Gynecology;  Laterality: Bilateral;  With Filshie Clips      OB History    Gravida  6   Para  3   Term  3   Preterm  0   AB  2   Living  3     SAB  2   TAB  0   Ectopic  0   Multiple  0   Live Births  3            Home Medications    Prior to  Admission medications   Medication Sig Start Date End Date Taking? Authorizing Provider  mirtazapine (REMERON SOL-TAB) 30 MG disintegrating tablet Take 1 tablet (30 mg total) by mouth at bedtime. 12/19/18   Malvin Johns, MD  phenytoin (DILANTIN) 300 MG ER capsule Take 1 capsule (300 mg total) by mouth at bedtime. 12/19/18   Malvin Johns, MD    Family History No family history on file.  Social History Social History   Tobacco Use  . Smoking status: Current Every Day Smoker    Packs/day: 1.00    Years: 10.00    Pack years: 10.00    Types: Cigarettes  . Smokeless tobacco: Never Used  Substance Use Topics  . Alcohol use: No    Comment: previos hx of drug abuse admitted to behavioral health in 2010; states occasionally on 03/06/12  . Drug use: Yes    Types: Other-see comments, Marijuana    Comment: heroin, meth, marijuana     Allergies   Ultram [tramadol hcl]   Review of Systems Review of Systems  Gastrointestinal: Positive for abdominal pain.  Genitourinary: Positive for flank pain.  All other systems reviewed and are negative.    Physical Exam Updated Vital Signs BP Marland Kitchen)  133/95 (BP Location: Right Arm)   Pulse 92   Temp 98.2 F (36.8 C) (Oral)   Resp 20   Ht 5\' 2"  (1.575 m)   Wt 77.1 kg   LMP 02/04/2019   SpO2 100%   BMI 31.09 kg/m   Physical Exam Vitals signs and nursing note reviewed.  Constitutional:      Appearance: She is well-developed.  HENT:     Head: Normocephalic.     Mouth/Throat:     Mouth: Mucous membranes are moist.  Eyes:     Pupils: Pupils are equal, round, and reactive to light.  Neck:     Musculoskeletal: Normal range of motion.  Cardiovascular:     Rate and Rhythm: Normal rate.  Pulmonary:     Effort: Pulmonary effort is normal.  Abdominal:     General: Abdomen is flat. There is no distension.     Comments: Tender left flank  Musculoskeletal: Normal range of motion.  Neurological:     General: No focal deficit present.     Mental  Status: She is alert and oriented to person, place, and time.  Psychiatric:        Mood and Affect: Mood normal.      ED Treatments / Results  Labs (all labs ordered are listed, but only abnormal results are displayed) Labs Reviewed  URINALYSIS, ROUTINE W REFLEX MICROSCOPIC    EKG None  Radiology No results found.  Procedures Procedures (including critical care time)  Medications Ordered in ED Medications - No data to display   Initial Impression / Assessment and Plan / ED Course  I have reviewed the triage vital signs and the nursing notes.  Pertinent labs & imaging results that were available during my care of the patient were reviewed by me and considered in my medical decision making (see chart for details).        MDM  Ua no sign of infection,.  I doubt stone disease.  Pt given ibuprofen here.  rx for ibuprofen   Final Clinical Impressions(s) / ED Diagnoses   Final diagnoses:  Acute left-sided low back pain without sciatica    ED Discharge Orders         Ordered    ibuprofen (ADVIL,MOTRIN) 600 MG tablet  Every 6 hours PRN     02/19/19 0029        An After Visit Summary was printed and given to the patient.   Elson Areas, [place] 02/19/19 0029    Dione Booze, MD 02/19/19 [phone]

## 2019-02-19 LAB — URINALYSIS, ROUTINE W REFLEX MICROSCOPIC
Bacteria, UA: NONE SEEN
Bilirubin Urine: NEGATIVE
Glucose, UA: NEGATIVE mg/dL
Hgb urine dipstick: NEGATIVE
Ketones, ur: NEGATIVE mg/dL
Leukocytes,Ua: NEGATIVE
Nitrite: POSITIVE — AB
PROTEIN: 100 mg/dL — AB
Specific Gravity, Urine: 1.034 — ABNORMAL HIGH (ref 1.005–1.030)
pH: 7 (ref 5.0–8.0)

## 2019-02-19 LAB — RAPID URINE DRUG SCREEN, HOSP PERFORMED
AMPHETAMINES: POSITIVE — AB
Barbiturates: POSITIVE — AB
Benzodiazepines: NOT DETECTED
Cocaine: POSITIVE — AB
Opiates: POSITIVE — AB
Tetrahydrocannabinol: NOT DETECTED

## 2019-02-19 MED ORDER — IBUPROFEN 600 MG PO TABS: 600.0000 mg | ORAL_TABLET | Freq: Four times a day (QID) | ORAL | 0 refills | Status: AC | PRN

## 2019-02-19 MED ORDER — IBUPROFEN 800 MG PO TABS
800.0000 mg | ORAL_TABLET | Freq: Once | ORAL | Status: AC
  Administered 2019-02-19: 800 mg via ORAL
  Filled 2019-02-19: qty 1

## 2019-02-20 LAB — URINE CULTURE

## 2019-04-07 ENCOUNTER — Emergency Department
Admission: EM | Admit: 2019-04-07 | Discharge: 2019-04-07 | Disposition: A | Discharge status: Home / Self Care | Payer: Self-pay | Attending: Emergency Medicine | Admitting: Emergency Medicine

## 2019-04-07 DIAGNOSIS — G40909 Epilepsy, unspecified, not intractable, without status epilepticus: Secondary | ICD-10-CM

## 2019-04-07 DIAGNOSIS — Z79899 Other long term (current) drug therapy: Secondary | ICD-10-CM | POA: Insufficient documentation

## 2019-04-07 DIAGNOSIS — R569 Unspecified convulsions: Secondary | ICD-10-CM | POA: Insufficient documentation

## 2019-04-07 DIAGNOSIS — F17210 Nicotine dependence, cigarettes, uncomplicated: Secondary | ICD-10-CM | POA: Insufficient documentation

## 2019-04-07 LAB — CBC WITH DIFFERENTIAL/PLATELET
Abs Immature Granulocytes: 0.02 10*3/uL (ref 0.00–0.07)
Basophils Absolute: 0.1 10*3/uL (ref 0.0–0.1)
Basophils Relative: 1 %
Eosinophils Absolute: 0.3 10*3/uL (ref 0.0–0.5)
Eosinophils Relative: 4 %
HCT: 36.9 % (ref 36.0–46.0)
Hemoglobin: 12.1 g/dL (ref 12.0–15.0)
Immature Granulocytes: 0 %
Lymphocytes Relative: 36 %
Lymphs Abs: 2.8 10*3/uL (ref 0.7–4.0)
MCH: 30 pg (ref 26.0–34.0)
MCHC: 32.8 g/dL (ref 30.0–36.0)
MCV: 91.6 fL (ref 80.0–100.0)
Monocytes Absolute: 0.7 10*3/uL (ref 0.1–1.0)
Monocytes Relative: 9 %
Neutro Abs: 3.9 10*3/uL (ref 1.7–7.7)
Neutrophils Relative %: 50 %
Platelets: 224 10*3/uL (ref 150–400)
RBC: 4.03 MIL/uL (ref 3.87–5.11)
RDW: 13.9 % (ref 11.5–15.5)
WBC: 7.8 10*3/uL (ref 4.0–10.5)
nRBC: 0 % (ref 0.0–0.2)

## 2019-04-07 LAB — BASIC METABOLIC PANEL
Anion gap: 9 (ref 5–15)
BUN: 11 mg/dL (ref 6–20)
CO2: 24 mmol/L (ref 22–32)
Calcium: 8.6 mg/dL — ABNORMAL LOW (ref 8.9–10.3)
Chloride: 105 mmol/L (ref 98–111)
Creatinine, Ser: 0.7 mg/dL (ref 0.44–1.00)
GFR calc Af Amer: >60 mL/min (ref >60)
GFR calc non Af Amer: >60 mL/min (ref >60)
Glucose, Bld: 97 mg/dL (ref 70–99)
Potassium: 4.2 mmol/L (ref 3.5–5.1)
Sodium: 138 mmol/L (ref 135–145)

## 2019-04-07 LAB — PHENYTOIN LEVEL, TOTAL: Phenytoin Lvl: 5.5 ug/mL — ABNORMAL LOW (ref 10.0–20.0)

## 2019-04-07 MED ORDER — PHENYTOIN SODIUM EXTENDED 100 MG PO CAPS
300.0000 mg | ORAL_CAPSULE | Freq: Once | ORAL | Status: AC
  Administered 2019-04-07: 300 mg via ORAL
  Filled 2019-04-07: qty 3

## 2019-04-07 NOTE — ED Notes (Signed)
Pt removed IV and all monitoring equipment

## 2019-04-07 NOTE — ED Triage Notes (Signed)
Pt arrives via EMS after being found in the parking lot at [facility] after having a seizure- pt has a history of epilepsy- EMS reports she has episodes of agitation and not alert- pt denies drug use- [facility] called and pt has been taking dialantin for seizures and was just given a prescription for suboxone- pt was hospitalized last week for SI

## 2019-04-07 NOTE — ED Notes (Signed)
Pt given water and stated she wanted to go home- this RN asked her to stay to speak with the doctor first- pt given phone to speak with husband

## 2019-04-07 NOTE — ED Notes (Signed)
Pt placed on bed alarm, seizure pads in place, yellow fall band and yellow socks placed on pt, fall risk sign on door, and blinds are open

## 2019-04-07 NOTE — Discharge Instructions (Addendum)
Continue to take your Dilantin, Lamictal, and other medications as prescribed.  Follow-up with your regular doctor.  Return to the ER for any new or worsening symptoms such as recurrent or persistent seizures or any other symptoms that concern you.

## 2019-04-07 NOTE — ED Notes (Signed)
Attempted IV x2 without success - Greg RN will attempt

## 2019-04-07 NOTE — ED Provider Notes (Signed)
[facility] Emergency Department Provider Note ____________________________________________   First MD Initiated Contact with Patient 04/07/19 1246     (approximate)  I have reviewed the triage vital signs and the nursing notes.   HISTORY  Chief Complaint Seizures   HPI Alexandra Ball is a 38 y.o. female with PMH as noted below who presents after an apparent seizure.  The patient states she has a history of seizure disorder and was recently off Dilantin because she was being titrated on Suboxone.  She states she also takes Lamictal.  Patient states she does not remember what happened today.  Per EMS, she had a seizure in a parking lot.  The patient reports some back pain and muscle soreness but denies any other acute symptoms.  She states she has been feeling well otherwise.  Past Medical History:  Diagnosis Date  . Bipolar disorder (HCC)   . Carpal tunnel syndrome   . Depression   . Noncompliance with medication regimen   . Seizures (HCC)    mild one 6months ago, off meds for pregnancy. Was on lamictil and dilatin. Last seizure 1 year ago as of 12/14/18  . Substance abuse (HCC)    Multiple substances    Patient Active Problem List   Diagnosis Date Noted  . MDD (major depressive disorder), recurrent episode, severe (HCC) 12/15/2018  . Severe recurrent major depression without psychotic features (HCC) 11/27/2017  . Substance use disorder 11/27/2017  . S/P tubal ligation 07/02/2011  . Postpartum care following vaginal delivery 06/30/2011    Past Surgical History:  Procedure Laterality Date  . CHOLECYSTECTOMY OPEN  2006   during second pregnancy  . OVARIAN CYST REMOVAL    . TUBAL LIGATION  07/01/2011   Procedure: POST PARTUM TUBAL LIGATION;  Surgeon: Scheryl Darter, MD;  Location: [facility];  Service: Gynecology;  Laterality: Bilateral;  With Filshie Clips     Prior to Admission medications   Medication Sig Start Date End Date Taking? Authorizing  Provider  ibuprofen (ADVIL,MOTRIN) 600 MG tablet Take 1 tablet (600 mg total) by mouth every 6 (six) hours as needed. 02/19/19   Elson Areas, PA-C  mirtazapine (REMERON SOL-TAB) 30 MG disintegrating tablet Take 1 tablet (30 mg total) by mouth at bedtime. 12/19/18   Malvin Johns, MD  phenytoin (DILANTIN) 100 MG ER capsule Take 300 mg by mouth daily. 12/22/18   [provider]  phenytoin (DILANTIN) 300 MG ER capsule Take 1 capsule (300 mg total) by mouth at bedtime. 12/19/18   Malvin Johns, MD    Allergies Ultram Marcia Brash hcl]  History reviewed. No pertinent family history.  Social History Social History   Tobacco Use  . Smoking status: Current Every Day Smoker    Packs/day: 1.00    Years: 10.00    Pack years: 10.00    Types: Cigarettes  . Smokeless tobacco: Never Used  Substance Use Topics  . Alcohol use: No    Comment: previos hx of drug abuse admitted to behavioral health in 2010; states occasionally on 03/06/12  . Drug use: Yes    Types: Other-see comments, Marijuana    Comment: heroin, meth, marijuana    Review of Systems  Constitutional: No fever. Eyes: No redness. ENT: No neck pain. Cardiovascular: Denies chest pain. Respiratory: Denies shortness of breath. Gastrointestinal: No vomiting. Genitourinary: Negative for flank pain.  Musculoskeletal: Positive for back pain. Skin: Negative for rash. Neurological: Negative for headaches, focal weakness or numbness.   ____________________________________________   PHYSICAL  EXAM:  VITAL SIGNS: ED Triage Vitals  Enc Vitals Group     BP 04/07/19 1300 109/63     Pulse Rate 04/07/19 1246 88     Resp 04/07/19 1255 20     Temp 04/07/19 1246 99.1 F (37.3 C)     Temp Source 04/07/19 1246 Oral     SpO2 04/07/19 1255 97 %     Weight 04/07/19 1249 165 lb (74.8 kg)     Height 04/07/19 1249 5\' 7"  (1.702 m)     Head Circumference --      Peak Flow --      Pain Score 04/07/19 1248 4     Pain Loc --      Pain  Edu? --      Excl. in GC? --     Constitutional: Alert and oriented.  Relatively well appearing and in no acute distress. Eyes: Conjunctivae are normal.  EOMI.  PERRLA. Head: Atraumatic. Nose: No congestion/rhinnorhea. Mouth/Throat: Mucous membranes are moist.   Neck: Normal range of motion.  Cardiovascular: Normal rate, regular rhythm. Grossly normal heart sounds.  Good peripheral circulation. Respiratory: Normal respiratory effort.  No retractions. Lungs CTAB. Gastrointestinal: No distention.  Musculoskeletal: Extremities warm and well perfused.  Neurologic:  Normal speech and language.  Motor intact in all extremities.  Normal coordination.  No gross focal neurologic deficits are appreciated.  Skin:  Skin is warm and dry. No rash noted. Psychiatric: Mood and affect are normal. Speech and behavior are normal.  ____________________________________________   LABS (all labs ordered are listed, but only abnormal results are displayed)  Labs Reviewed  BASIC METABOLIC PANEL - Abnormal; Notable for the following components:      Result Value   Calcium 8.6 (*)    All other components within normal limits  PHENYTOIN LEVEL, TOTAL - Abnormal; Notable for the following components:   Phenytoin Lvl 5.5 (*)    All other components within normal limits  CBC WITH DIFFERENTIAL/PLATELET  LAMOTRIGINE LEVEL   ____________________________________________  EKG  ED ECG REPORT I, Dionne BucySebastian Keianna Signer, the attending physician, personally viewed and interpreted this ECG.  Date: 04/07/2019 EKG Time: 1248 Rate: 76 Rhythm: normal sinus rhythm QRS Axis: normal Intervals: normal ST/T Wave abnormalities: normal Narrative Interpretation: no evidence of acute ischemia  ____________________________________________  RADIOLOGY    ____________________________________________   PROCEDURES  Procedure(s) performed: No  Procedures  Critical Care performed: No  ____________________________________________   INITIAL IMPRESSION / ASSESSMENT AND PLAN / ED COURSE  Pertinent labs & imaging results that were available during my care of the patient were reviewed by me and considered in my medical decision making (see chart for details).  38 year old female with a history of a seizure disorder and other PMH as noted above presents after apparent seizure.  She has recently been off of her Dilantin apparently because she was being titrated on Suboxone.  Patient reports some muscle soreness but denies other acute complaints.  She states she is compliant with her other medications.  Per EMS, the patient was intermittently confused and agitated while on route.  Currently she seems mildly postictal but is alert and able to answer all my questions.  Neuro exam is nonfocal.  The remainder of the exam is as described above.  I reviewed the past medical records in Epic.  The patient was admitted in January for possible psychosis and substance abuse.  She states that she used heroin, but the patient denies any active drug use at this time.  Overall  presentation is consistent with a seizure and postictal phase.  We will obtain labs and an EKG.  There is no indication for imaging at this time.  If the Dilantin level is low I will give a loading dose.  I anticipate discharge home unless the patient has recurrent or persistent seizures.  ----------------------------------------- 3:11 PM on 04/07/2019 -----------------------------------------  The patient is awake and alert and would like to go home.  She is asymptomatic at this time.  She does not want to wait for IV Dilantin loading, so I will give her an oral ER dose.  She is stable for discharge at this time.  Return precautions given, and she expresses understanding. ____________________________________________   FINAL CLINICAL IMPRESSION(S) / ED DIAGNOSES  Final diagnoses:  Seizure disorder (HCC)      NEW  MEDICATIONS STARTED DURING THIS VISIT:  Discharge Medication List as of 04/07/2019  3:01 PM       Note:  This document was prepared using Dragon voice recognition software and may include unintentional dictation errors.    Dionne Bucy, MD 04/07/19 1511

## 2019-04-07 NOTE — ED Notes (Signed)
Dr Marisa Severin notified of pt request to leave

## 2019-04-10 LAB — LAMOTRIGINE LEVEL: Lamotrigine Lvl: NOT DETECTED ug/mL (ref 2.0–20.0)

## 2019-09-08 ENCOUNTER — Emergency Department (HOSPITAL_COMMUNITY)
Admission: EM | Admit: 2019-09-08 | Discharge: 2019-09-08 | Disposition: A | Discharge status: Home / Self Care | Payer: Self-pay

## 2019-09-08 ENCOUNTER — Other Ambulatory Visit: Payer: Self-pay

## 2019-10-01 ENCOUNTER — Encounter (HOSPITAL_COMMUNITY): Payer: Self-pay | Admitting: Emergency Medicine

## 2019-10-01 ENCOUNTER — Other Ambulatory Visit: Payer: Self-pay

## 2019-10-01 ENCOUNTER — Emergency Department (HOSPITAL_COMMUNITY)
Admission: EM | Admit: 2019-10-01 | Discharge: 2019-10-01 | Disposition: A | Discharge status: Home / Self Care | Payer: Self-pay | Attending: Emergency Medicine | Admitting: Emergency Medicine

## 2019-10-01 DIAGNOSIS — F1110 Opioid abuse, uncomplicated: Secondary | ICD-10-CM | POA: Insufficient documentation

## 2019-10-01 DIAGNOSIS — F17210 Nicotine dependence, cigarettes, uncomplicated: Secondary | ICD-10-CM | POA: Insufficient documentation

## 2019-10-01 MED ORDER — CLONIDINE HCL 0.1 MG PO TABS: 0.1000 mg | ORAL_TABLET | Freq: Two times a day (BID) | ORAL | 11 refills | Status: AC

## 2019-10-01 MED ORDER — CLONIDINE HCL 0.1 MG PO TABS
0.1000 mg | ORAL_TABLET | Freq: Once | ORAL | Status: AC
  Administered 2019-10-01: 0.1 mg via ORAL
  Filled 2019-10-01: qty 1

## 2019-10-01 MED ORDER — CLONIDINE HCL 0.1 MG PO TABS: 0.1000 mg | ORAL_TABLET | Freq: Three times a day (TID) | ORAL | 11 refills | Status: DC

## 2019-10-01 NOTE — Discharge Instructions (Signed)
Take the clonidine as directed.  Resource packet provided by the nurse.  Provides resources locally and also inpatient resources.

## 2019-10-01 NOTE — ED Triage Notes (Signed)
Pt states she wants detox from meth and heroin. Last use was on the ride here. Pt very tearful in triage.

## 2019-10-01 NOTE — ED Provider Notes (Addendum)
[facility] EMERGENCY DEPARTMENT Provider Note   CSN: [id number] Arrival date & time: 10/01/19  2006     History   Chief Complaint Chief Complaint  Patient presents with  . Detox    HPI Alexandra HAGSTROM is a 38 y.o. female.     Patient presenting for help with substance abuse.  Patient's main concern is heroin she occasionally uses meth.  Last used heroin small amount prior to coming here.  Patient was at the hospital in [place] earlier they did not give her any medication did give her some they did give her some resources.  But patient is looking for inpatient.  Patient denies any suicidal homicidal ideation.  Patient denies any fevers or upper respiratory symptoms.  Patient denies any nausea or vomiting.  Not showing any outward signs of withdrawal.     Past Medical History:  Diagnosis Date  . Bipolar disorder (HCC)   . Carpal tunnel syndrome   . Depression   . Noncompliance with medication regimen   . Seizures (HCC)    mild one 85months ago, off meds for pregnancy. Was on lamictil and dilatin. Last seizure 1 year ago as of 12/14/18  . Substance abuse (HCC)    Multiple substances    Patient Active Problem List   Diagnosis Date Noted  . MDD (major depressive disorder), recurrent episode, severe (HCC) 12/15/2018  . Severe recurrent major depression without psychotic features (HCC) 11/27/2017  . Substance use disorder 11/27/2017  . S/P tubal ligation 07/02/2011  . Postpartum care following vaginal delivery 06/30/2011    Past Surgical History:  Procedure Laterality Date  . CHOLECYSTECTOMY OPEN  2006   during second pregnancy  . OVARIAN CYST REMOVAL    . TUBAL LIGATION  07/01/2011   Procedure: POST PARTUM TUBAL LIGATION;  Surgeon: Scheryl Darter, MD;  Location: [facility];  Service: Gynecology;  Laterality: Bilateral;  With Filshie Clips      OB History    Gravida  6   Para  3   Term  3   Preterm  0   AB  2   Living  3     SAB  2   TAB  0   Ectopic  0   Multiple  0   Live Births  3            Home Medications    Prior to Admission medications   Medication Sig Start Date End Date Taking? Authorizing Provider  cloNIDine (CATAPRES) 0.1 MG tablet Take 1 tablet (0.1 mg total) by mouth 2 (two) times daily. 10/01/19   Vanetta Mulders, MD  ibuprofen (ADVIL,MOTRIN) 600 MG tablet Take 1 tablet (600 mg total) by mouth every 6 (six) hours as needed. 02/19/19   Elson Areas, PA-C  mirtazapine (REMERON SOL-TAB) 30 MG disintegrating tablet Take 1 tablet (30 mg total) by mouth at bedtime. 12/19/18   Malvin Johns, MD  phenytoin (DILANTIN) 100 MG ER capsule Take 300 mg by mouth daily. 12/22/18   [provider]  phenytoin (DILANTIN) 300 MG ER capsule Take 1 capsule (300 mg total) by mouth at bedtime. 12/19/18   Malvin Johns, MD    Family History No family history on file.  Social History Social History   Tobacco Use  . Smoking status: Current Every Day Smoker    Packs/day: 1.00    Years: 10.00    Pack years: 10.00    Types: Cigarettes  . Smokeless tobacco: Never Used  Substance Use Topics  .  Alcohol use: No    Comment: previos hx of drug abuse admitted to behavioral health in 2010; states occasionally on 03/06/12  . Drug use: Yes    Types: Other-see comments, Marijuana    Comment: heroin, meth, marijuana     Allergies   Ultram [tramadol hcl]   Review of Systems Review of Systems  Constitutional: Negative for chills and fever.  HENT: Negative for rhinorrhea and sore throat.   Eyes: Negative for visual disturbance.  Respiratory: Negative for cough and shortness of breath.   Cardiovascular: Negative for chest pain and leg swelling.  Gastrointestinal: Negative for abdominal pain, diarrhea, nausea and vomiting.  Genitourinary: Negative for dysuria.  Musculoskeletal: Negative for back pain and neck pain.  Skin: Negative for rash.  Neurological: Negative for dizziness, light-headedness and headaches.  Hematological: Does not  bruise/bleed easily.  Psychiatric/Behavioral: Negative for confusion. The patient is not nervous/anxious.      Physical Exam Updated Vital Signs BP (!) 150/99   Pulse 66   Temp 97.8 F (36.6 C)   Resp 18   Ht 1.575 m (5\' 2" )   Wt 77.1 kg   SpO2 98%   BMI 31.09 kg/m   Physical Exam Vitals signs and nursing note reviewed.  Constitutional:      General: She is not in acute distress.    Appearance: Normal appearance. She is well-developed. She is not toxic-appearing.  HENT:     Head: Normocephalic and atraumatic.  Eyes:     Extraocular Movements: Extraocular movements intact.     Conjunctiva/sclera: Conjunctivae normal.     Pupils: Pupils are equal, round, and reactive to light.  Neck:     Musculoskeletal: Normal range of motion and neck supple.  Cardiovascular:     Rate and Rhythm: Normal rate and regular rhythm.     Heart sounds: No murmur.  Pulmonary:     Effort: Pulmonary effort is normal. No respiratory distress.     Breath sounds: Normal breath sounds.  Abdominal:     Palpations: Abdomen is soft.     Tenderness: There is no abdominal tenderness.  Musculoskeletal: Normal range of motion.  Skin:    General: Skin is warm and dry.  Neurological:     General: No focal deficit present.     Mental Status: She is alert and oriented to person, place, and time.      ED Treatments / Results  Labs (all labs ordered are listed, but only abnormal results are displayed) Labs Reviewed - No data to display  EKG None  Radiology No results found.  Procedures Procedures (including critical care time)  Medications Ordered in ED Medications  cloNIDine (CATAPRES) tablet 0.1 mg (has no administration in time range)     Initial Impression / Assessment and Plan / ED Course  I have reviewed the triage vital signs and the nursing notes.  Pertinent labs & imaging results that were available during my care of the patient were reviewed by me and considered in my medical  decision making (see chart for details).        Patient will be started on clonidine.  0.1 mg provided here.  And then will have some to take at home.  Prescription provided.  Nursing will provide resources for outpatient and inpatient care.  [facility] to patient that [facility] does not do any inpatient detox for heroin.  Patient reassured that is important to work towards getting off the heroin use the resources  provided.  Nontoxic in no acute distress.  Normal vital signs.  Patient not currently an active withdrawal.  Final Clinical Impressions(s) / ED Diagnoses   Final diagnoses:  Heroin abuse [facility])    ED Discharge Orders         Ordered    cloNIDine (CATAPRES) 0.1 MG tablet  3 times daily,   Status:  Discontinued     10/01/19 2214    cloNIDine (CATAPRES) 0.1 MG tablet  2 times daily     10/01/19 2216           Fredia Sorrow, MD 10/01/19 2221    Fredia Sorrow, MD 10/01/19 2221

## 2019-10-03 ENCOUNTER — Emergency Department (HOSPITAL_COMMUNITY)
Admission: EM | Admit: 2019-10-03 | Discharge: 2019-10-04 | Disposition: A | Discharge status: Home / Self Care | Payer: Self-pay | Attending: Emergency Medicine | Admitting: Emergency Medicine

## 2019-10-03 ENCOUNTER — Encounter (HOSPITAL_COMMUNITY): Payer: Self-pay

## 2019-10-03 ENCOUNTER — Other Ambulatory Visit: Payer: Self-pay

## 2019-10-03 DIAGNOSIS — F1110 Opioid abuse, uncomplicated: Secondary | ICD-10-CM

## 2019-10-03 DIAGNOSIS — F319 Bipolar disorder, unspecified: Secondary | ICD-10-CM | POA: Insufficient documentation

## 2019-10-03 DIAGNOSIS — F1190 Opioid use, unspecified, uncomplicated: Secondary | ICD-10-CM | POA: Insufficient documentation

## 2019-10-03 DIAGNOSIS — F17210 Nicotine dependence, cigarettes, uncomplicated: Secondary | ICD-10-CM | POA: Insufficient documentation

## 2019-10-03 DIAGNOSIS — R45851 Suicidal ideations: Secondary | ICD-10-CM | POA: Insufficient documentation

## 2019-10-03 DIAGNOSIS — Z79899 Other long term (current) drug therapy: Secondary | ICD-10-CM | POA: Insufficient documentation

## 2019-10-03 DIAGNOSIS — F339 Major depressive disorder, recurrent, unspecified: Secondary | ICD-10-CM | POA: Insufficient documentation

## 2019-10-03 LAB — COMPREHENSIVE METABOLIC PANEL
ALT: 25 U/L (ref 0–44)
AST: 17 U/L (ref 15–41)
Albumin: 4.4 g/dL (ref 3.5–5.0)
Alkaline Phosphatase: 175 U/L — ABNORMAL HIGH (ref 38–126)
Anion gap: 10 (ref 5–15)
BUN: 8 mg/dL (ref 6–20)
CO2: 23 mmol/L (ref 22–32)
Calcium: 8.8 mg/dL — ABNORMAL LOW (ref 8.9–10.3)
Chloride: 104 mmol/L (ref 98–111)
Creatinine, Ser: 0.67 mg/dL (ref 0.44–1.00)
GFR calc Af Amer: >60 mL/min (ref >60)
GFR calc non Af Amer: >60 mL/min (ref >60)
Glucose, Bld: 92 mg/dL (ref 70–99)
Potassium: 2.9 mmol/L — ABNORMAL LOW (ref 3.5–5.1)
Sodium: 137 mmol/L (ref 135–145)
Total Bilirubin: 0.3 mg/dL (ref 0.3–1.2)
Total Protein: 8 g/dL (ref 6.5–8.1)

## 2019-10-03 LAB — CBC
HCT: 41.1 % (ref 36.0–46.0)
Hemoglobin: 12.8 g/dL (ref 12.0–15.0)
MCH: 28 pg (ref 26.0–34.0)
MCHC: 31.1 g/dL (ref 30.0–36.0)
MCV: 89.9 fL (ref 80.0–100.0)
Platelets: 365 10*3/uL (ref 150–400)
RBC: 4.57 MIL/uL (ref 3.87–5.11)
RDW: 15.1 % (ref 11.5–15.5)
WBC: 10.1 10*3/uL (ref 4.0–10.5)
nRBC: 0 % (ref 0.0–0.2)

## 2019-10-03 LAB — ACETAMINOPHEN LEVEL: Acetaminophen (Tylenol), Serum: <10 ug/mL — ABNORMAL LOW (ref 10–30)

## 2019-10-03 LAB — SALICYLATE LEVEL: Salicylate Lvl: <7 mg/dL (ref 2.8–30.0)

## 2019-10-03 LAB — ETHANOL: Alcohol, Ethyl (B): <10 mg/dL (ref <10)

## 2019-10-03 MED ORDER — POTASSIUM CHLORIDE CRYS ER 20 MEQ PO TBCR
40.0000 meq | EXTENDED_RELEASE_TABLET | Freq: Once | ORAL | Status: AC
  Administered 2019-10-04: 40 meq via ORAL
  Filled 2019-10-03: qty 2

## 2019-10-03 NOTE — ED Notes (Signed)
Pt had syringe needle in pocket, pt placed in sharps container. Pt wanded by security prior to changing into psych scrubs.

## 2019-10-03 NOTE — ED Provider Notes (Signed)
[facility] EMERGENCY DEPARTMENT Provider Note   CSN: [id number] Arrival date & time: 10/03/19  1809     History   Chief Complaint Chief Complaint  Patient presents with  . V70.1    HPI Alexandra Ball is a 38 y.o. female.     HPI  This patient is a 38 y/o female - she is addicted to heroin, She presents from home with c/o suicidal ideations which she reports have been going on for 3-4 days -that being said review of the medical record shows that the patient had been evaluated 2 days ago in this emergency department and denied suicidal thoughts at that time.  She has been trying to get into a treatment facility for her heroin as she wants to stop using.  She presented with a needle in her pocket, she was wanted and placed into scrubs on arrival.  The patient reports that her suicidal thoughts came to the point where she actually tried to overdose on heroin to kill herself this afternoon.  She states it did not work so she decided to come to the hospital to get help.  She states that she moved out of the house where she was living with her husband because he did not want to stop using heroin and she did.  She reports that she does not have any way to get around, she would like to go to detox but at this point she is so depressed and suicidal she does not know if that will be option for her    Past Medical History:  Diagnosis Date  . Bipolar disorder (HCC)   . Carpal tunnel syndrome   . Depression   . Noncompliance with medication regimen   . Seizures (HCC)    mild one 27months ago, off meds for pregnancy. Was on lamictil and dilatin. Last seizure 1 year ago as of 12/14/18  . Substance abuse (HCC)    Multiple substances    Patient Active Problem List   Diagnosis Date Noted  . MDD (major depressive disorder), recurrent episode, severe (HCC) 12/15/2018  . Severe recurrent major depression without psychotic features (HCC) 11/27/2017  . Substance use disorder 11/27/2017   . S/P tubal ligation 07/02/2011  . Postpartum care following vaginal delivery 06/30/2011    Past Surgical History:  Procedure Laterality Date  . CHOLECYSTECTOMY OPEN  2006   during second pregnancy  . OVARIAN CYST REMOVAL    . TUBAL LIGATION  07/01/2011   Procedure: POST PARTUM TUBAL LIGATION;  Surgeon: Scheryl Darter, MD;  Location: [facility];  Service: Gynecology;  Laterality: Bilateral;  With Filshie Clips      OB History    Gravida  6   Para  3   Term  3   Preterm  0   AB  2   Living  3     SAB  2   TAB  0   Ectopic  0   Multiple  0   Live Births  3            Home Medications    Prior to Admission medications   Medication Sig Start Date End Date Taking? Authorizing Provider  cloNIDine (CATAPRES) 0.1 MG tablet Take 1 tablet (0.1 mg total) by mouth 2 (two) times daily. 10/01/19   Vanetta Mulders, MD  ibuprofen (ADVIL,MOTRIN) 600 MG tablet Take 1 tablet (600 mg total) by mouth every 6 (six) hours as needed. 02/19/19   Elson Areas, PA-C  mirtazapine (REMERON  SOL-TAB) 30 MG disintegrating tablet Take 1 tablet (30 mg total) by mouth at bedtime. 12/19/18   Johnn Hai, MD  phenytoin (DILANTIN) 100 MG ER capsule Take 300 mg by mouth daily. 12/22/18   [provider]  phenytoin (DILANTIN) 300 MG ER capsule Take 1 capsule (300 mg total) by mouth at bedtime. 12/19/18   Johnn Hai, MD    Family History No family history on file.  Social History Social History   Tobacco Use  . Smoking status: Current Every Day Smoker    Packs/day: 1.00    Years: 10.00    Pack years: 10.00    Types: Cigarettes  . Smokeless tobacco: Never Used  Substance Use Topics  . Alcohol use: No    Comment: previos hx of drug abuse admitted to behavioral health in 2010; states occasionally on 03/06/12  . Drug use: Yes    Types: Other-see comments, Marijuana, Methamphetamines    Comment: Heroin last used today, Meth yesterday, Marijuana today     Allergies   Ultram [tramadol  hcl]   Review of Systems Review of Systems  All other systems reviewed and are negative.    Physical Exam Updated Vital Signs BP 137/85 (BP Location: Right Arm)   Pulse (!) 106   Temp 98.6 F (37 C) (Oral)   Resp 20   Ht 1.575 m (5\' 2" )   Wt 77.1 kg   LMP 10/03/2019   SpO2 97%   BMI 31.09 kg/m   Physical Exam Vitals signs and nursing note reviewed.  Constitutional:      General: She is not in acute distress.    Appearance: She is well-developed.  HENT:     Head: Normocephalic and atraumatic.     Mouth/Throat:     Pharynx: No oropharyngeal exudate.  Eyes:     General: No scleral icterus.       Right eye: No discharge.        Left eye: No discharge.     Conjunctiva/sclera: Conjunctivae normal.     Pupils: Pupils are equal, round, and reactive to light.  Neck:     Musculoskeletal: Normal range of motion and neck supple.     Thyroid: No thyromegaly.     Vascular: No JVD.  Cardiovascular:     Rate and Rhythm: Normal rate and regular rhythm.     Heart sounds: Normal heart sounds. No murmur. No friction rub. No gallop.   Pulmonary:     Effort: Pulmonary effort is normal. No respiratory distress.     Breath sounds: Normal breath sounds. No wheezing or rales.  Abdominal:     General: Bowel sounds are normal. There is no distension.     Palpations: Abdomen is soft. There is no mass.     Tenderness: There is no abdominal tenderness.  Musculoskeletal: Normal range of motion.        General: No tenderness.  Lymphadenopathy:     Cervical: No cervical adenopathy.  Skin:    General: Skin is warm and dry.     Findings: No erythema or rash.  Neurological:     Mental Status: She is alert.     Coordination: Coordination normal.  Psychiatric:     Comments: Appears mildly depressed, endorses suicidal thoughts, she is not actively hallucinating      ED Treatments / Results  Labs (all labs ordered are listed, but only abnormal results are displayed) Labs Reviewed   COMPREHENSIVE METABOLIC PANEL - Abnormal; Notable for the following components:  Result Value   Potassium 2.9 (*)    Calcium 8.8 (*)    Alkaline Phosphatase 175 (*)    All other components within normal limits  ACETAMINOPHEN LEVEL - Abnormal; Notable for the following components:   Acetaminophen (Tylenol), Serum <10 (*)    All other components within normal limits  ETHANOL  SALICYLATE LEVEL  CBC  RAPID URINE DRUG SCREEN, HOSP PERFORMED  POC URINE PREG, ED    EKG EKG Interpretation  Date/Time:  Tuesday October 03 2019 19:38:47 EST Ventricular Rate:  85 PR Interval:  144 QRS Duration: 84 QT Interval:  408 QTC Calculation: 485 R Axis:   57 Text Interpretation: Normal sinus rhythm Prolonged QT Abnormal ECG since last tracing no significant change Confirmed by Eber HongMiller, Kierrah Kilbride ([id number]) on 10/03/2019 7:42:50 PM   Radiology No results found.  Procedures Procedures (including critical care time)  Medications Ordered in ED Medications  potassium chloride SA (KLOR-CON) CR tablet 40 mEq (has no administration in time range)     Initial Impression / Assessment and Plan / ED Course  I have reviewed the triage vital signs and the nursing notes.  Pertinent labs & imaging results that were available during my care of the patient were reviewed by me and considered in my medical decision making (see chart for details).        The patient will need to be seen by psychiatry to be clearance, she is aware that there is no detox facilities available, she may get some help for her depression and suicidal thoughts though she appears to be in a place where she is making poor decisions.  Consult with psychiatry  The patient is medically cleared for evaluation by psychiatry.  Labs are pending however her vital signs are unremarkable and her exam is also unremarkable except for the emotional and psychiatric lability  The patient has been seen by the psychiatry team, they are going to  seek inpatient treatment for this patient who has ongoing suicidal thoughts secondary to her decompensated drug abuse and volatile relationships.  Pending placement at change of shift  Final Clinical Impressions(s) / ED Diagnoses   Final diagnoses:  Suicidal ideation  Heroin abuse [facility](HCC)    ED Discharge Orders    None       Eber HongMiller, Pari Lombard, MD 10/03/19 2313

## 2019-10-03 NOTE — ED Triage Notes (Signed)
Pt brought herself to ED for suicidal thoughts. Pt states she is addicted to heroin and her plan is to overdose on heroin. Pt states she has been having these thoughts for 3-4 days, pt states she left her husband, has been homeless, pt off her bipolar medications x couple months. Pt denies hallucinations.

## 2019-10-03 NOTE — BH Assessment (Addendum)
Tele Assessment Note   Patient Name: Alexandra Ball MRN: [id number] Referring Physician: Dr. Noemi Chapel Location of Patient: [facility] Location of Provider: [place] is an 38 y.o. female presenting with SI with attempted overdose on heroin and heroin addiction. Patient reported taking a shot of heroin with intent to kill herself, "instead I just woke up". Patient reported onset of SI was several days ago. Patient was evaluated 2 days ago at [facility] where she denied suicidal thoughts at that time and presented for heroin detox. Patient has been trying to get into a detox facility for heroin use. Patient reported using heroin for past 4 years and the longest period of soberiety was for 6 months. Patient reported using heroin 1-2 grams daily. Patient reported worsening depression. Patient denied prior inpatient mental health treatment. Patient denied HI and psychosis.   Patient reported moving out of the house where she was living with her husband because he did not want her to stop using heroin and she wants to stop. Patient reported having 3 children (17, 14,8) that lives with their father. 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 Patient reported child and adulthood trauma. Patient was cooperative during assessment.   Diagnosis: Opioid Use Disorder and Major depressive disorder  Past Medical History:  Past Medical History:  Diagnosis Date  . Bipolar disorder ([place])   . Carpal tunnel syndrome   . Depression   . Noncompliance with medication regimen   . Seizures ([place])    mild one 43months ago, off meds for pregnancy. Was on lamictil and dilatin. Last seizure 1 year ago as of 12/14/18  . Substance abuse ([place])    Multiple substances    Past Surgical History:  Procedure Laterality Date  . CHOLECYSTECTOMY OPEN  2006   during second pregnancy  . OVARIAN CYST REMOVAL    . TUBAL LIGATION  07/01/2011   Procedure: POST PARTUM TUBAL LIGATION;   Surgeon: Emeterio Reeve, MD;  Location: [facility];  Service: Gynecology;  Laterality: Bilateral;  With Filshie Clips     Family History: No family history on file.  Social History:  reports that she has been smoking cigarettes. She has a 10.00 pack-year smoking history. She has never used smokeless tobacco. She reports current drug use. Drugs: Other-see comments, Marijuana, and Methamphetamines. She reports that she does not drink alcohol.  Additional Social History:  Alcohol / Drug Use Pain Medications: see MAR Prescriptions: see MAR Over the Counter: see MAR  CIWA: CIWA-Ar BP: 137/85 Pulse Rate: (!) 106 COWS:    Allergies:  Allergies  Allergen Reactions  . Ultram [Tramadol Hcl] Hives and Swelling    Bodily swelling    Home Medications: (Not in a hospital admission)   OB/GYN Status:  Patient's last menstrual period was 10/03/2019.  General Assessment Data Location of Assessment: [facility] TTS Assessment: In system Is this a Tele or Face-to-Face Assessment?: Tele Assessment Is this an Initial Assessment or a Re-assessment for this encounter?: Initial Assessment Patient Accompanied by:: N/A Language Other than English: No Living Arrangements: (husband) What gender do you identify as?: Female Marital status: Married Pregnancy Status: Unknown Living Arrangements: (husband) Can pt return to current living arrangement?: Yes Admission Status: Voluntary Is patient capable of signing voluntary admission?: Yes Referral Source: Self/Family/Friend     Crisis Care Plan Living Arrangements: (husband) Legal Guardian: (self) Name of Psychiatrist: (none) Name of Therapist: (none)  Education Status Is patient currently in school?: No Is the patient employed,  unemployed or receiving disability?: Unemployed  Risk to self with the past 6 months Suicidal Ideation: Yes-Currently Present Has patient been a risk to self within the past 6 months prior to admission? : Yes Suicidal Intent:  Yes-Currently Present Has patient had any suicidal intent within the past 6 months prior to admission? : Yes Is patient at risk for suicide?: Yes Suicidal Plan?: Yes-Currently Present Has patient had any suicidal plan within the past 6 months prior to admission? : Yes Specify Current Suicidal Plan: (attempted heroin overdose today) Access to Means: Yes Specify Access to Suicidal Means: (heroin addict) What has been your use of drugs/alcohol within the last 12 months?: (meth and heroin) Previous Attempts/Gestures: Yes How many times?: (2) Other Self Harm Risks: (addiction) Triggers for Past Attempts: (addiction) Intentional Self Injurious Behavior: None Family Suicide History: No Recent stressful life event(s): (addiction and family discord) Persecutory voices/beliefs?: No Depression: Yes Depression Symptoms: Insomnia, Tearfulness, Isolating, Fatigue, Guilt, Loss of interest in usual pleasures, Feeling worthless/self pity, Feeling angry/irritable Substance abuse history and/or treatment for substance abuse?: No Suicide prevention information given to non-admitted patients: Not applicable  Risk to Others within the past 6 months Homicidal Ideation: No Does patient have any lifetime risk of violence toward others beyond the six months prior to admission? : No Thoughts of Harm to Others: No Current Homicidal Intent: No Current Homicidal Plan: No Access to Homicidal Means: No Identified Victim: (n/a) History of harm to others?: No Assessment of Violence: None Noted Violent Behavior Description: (none reported) Does patient have access to weapons?: No Criminal Charges Pending?: No Does patient have a court date: No Is patient on probation?: No  Psychosis Hallucinations: None noted Delusions: None noted  Mental Status Report Appearance/Hygiene: Unremarkable Eye Contact: Fair Motor Activity: Freedom of movement, Restlessness Speech: Logical/coherent Level of Consciousness:  Alert Mood: Depressed, Anxious Affect: Anxious, Depressed Anxiety Level: Moderate Thought Processes: Coherent, Relevant Judgement: Impaired Orientation: Person, Place, Time, Situation Obsessive Compulsive Thoughts/Behaviors: None  Cognitive Functioning Concentration: Good Memory: Recent Intact Is patient IDD: No Insight: Poor Impulse Control: Poor Appetite: Good Have you had any weight changes? : No Change Sleep: No Change Total Hours of Sleep: (8) Vegetative Symptoms: None  ADLScreening [facility] Assessment Services) Patient's cognitive ability adequate to safely complete daily activities?: Yes Patient able to express need for assistance with ADLs?: Yes Independently performs ADLs?: Yes (appropriate for developmental age)  Prior Inpatient Therapy Prior Inpatient Therapy: No  Prior Outpatient Therapy Prior Outpatient Therapy: No Does patient have an ACCT team?: No Does patient have Intensive In-House Services?  : No Does patient have [facility] services? : No Does patient have P4CC services?: No  ADL Screening (condition at time of admission) Patient's cognitive ability adequate to safely complete daily activities?: Yes Patient able to express need for assistance with ADLs?: Yes Independently performs ADLs?: Yes (appropriate for developmental age)  [employer]chant [employer] officer (For Healthcare) Does Patient Have a Medical Advance Directive?: No   Disposition:  Disposition Initial Assessment Completed for this Encounter: Yes  Renaye Rakers, NP, patient meets inpatient criteria. TTS to secure placement.  This service was provided via telemedicine using a 2-way, interactive audio and video technology.  Names of all persons participating in this telemedicine service and their role in this encounter. Name: Alexandra Ball Role: Patient  Name: Al Corpus Role: TTS Clinician  Name:  Role:   Name:  Role:     Burnetta Sabin 10/03/2019 10:43 PM

## 2019-10-04 LAB — RAPID URINE DRUG SCREEN, HOSP PERFORMED
Amphetamines: POSITIVE — AB
Barbiturates: NOT DETECTED
Benzodiazepines: NOT DETECTED
Cocaine: NOT DETECTED
Opiates: NOT DETECTED
Tetrahydrocannabinol: POSITIVE — AB

## 2019-10-04 MED ORDER — NAPROXEN 250 MG PO TABS: 500.0000 mg | ORAL_TABLET | Freq: Two times a day (BID) | ORAL | Status: DC | PRN

## 2019-10-04 MED ORDER — CLONIDINE HCL 0.1 MG PO TABS: 0.1000 mg | ORAL_TABLET | Freq: Every day | ORAL | Status: DC

## 2019-10-04 MED ORDER — CLONIDINE HCL 0.1 MG PO TABS
0.1000 mg | ORAL_TABLET | Freq: Four times a day (QID) | ORAL | Status: DC
  Administered 2019-10-04: 0.1 mg via ORAL
  Filled 2019-10-04: qty 1

## 2019-10-04 MED ORDER — CLONIDINE HCL 0.1 MG PO TABS: 0.1000 mg | ORAL_TABLET | ORAL | Status: DC

## 2019-10-04 MED ORDER — POTASSIUM CHLORIDE CRYS ER 20 MEQ PO TBCR
40.0000 meq | EXTENDED_RELEASE_TABLET | Freq: Once | ORAL | Status: AC
  Administered 2019-10-04: 06:00:00 40 meq via ORAL
  Filled 2019-10-04: qty 2

## 2019-10-04 MED ORDER — CLONIDINE HCL 0.1 MG PO TABS
0.1000 mg | ORAL_TABLET | Freq: Once | ORAL | Status: AC
  Administered 2019-10-04: 06:00:00 0.1 mg via ORAL
  Filled 2019-10-04: qty 1

## 2019-10-04 MED ORDER — LOPERAMIDE HCL 2 MG PO CAPS: 2.0000 mg | ORAL_CAPSULE | ORAL | Status: DC | PRN

## 2019-10-04 MED ORDER — HYDROXYZINE HCL 25 MG PO TABS
25.0000 mg | ORAL_TABLET | Freq: Four times a day (QID) | ORAL | Status: DC | PRN
  Administered 2019-10-04: 10:00:00 25 mg via ORAL
  Filled 2019-10-04: qty 1

## 2019-10-04 MED ORDER — METHOCARBAMOL 500 MG PO TABS
500.0000 mg | ORAL_TABLET | Freq: Three times a day (TID) | ORAL | Status: DC | PRN
  Administered 2019-10-04: 10:00:00 500 mg via ORAL
  Filled 2019-10-04: qty 1

## 2019-10-04 MED ORDER — ONDANSETRON 4 MG PO TBDP
4.0000 mg | ORAL_TABLET | Freq: Four times a day (QID) | ORAL | Status: DC | PRN
  Administered 2019-10-04 (×2): 4 mg via ORAL
  Filled 2019-10-04: qty 1

## 2019-10-04 MED ORDER — DICYCLOMINE HCL 10 MG PO CAPS
20.0000 mg | ORAL_CAPSULE | Freq: Four times a day (QID) | ORAL | Status: DC | PRN
  Administered 2019-10-04: 20 mg via ORAL
  Filled 2019-10-04: qty 2

## 2019-10-04 MED ORDER — ONDANSETRON 4 MG PO TBDP
4.0000 mg | ORAL_TABLET | Freq: Once | ORAL | Status: DC
  Filled 2019-10-04: qty 1

## 2019-10-04 MED ORDER — NICOTINE 21 MG/24HR TD PT24
21.0000 mg | MEDICATED_PATCH | Freq: Once | TRANSDERMAL | Status: DC
  Administered 2019-10-04: 10:00:00 21 mg via TRANSDERMAL
  Filled 2019-10-04: qty 1
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
## 2019-10-04 NOTE — Progress Notes (Signed)
Pt has been psychiatrically cleared. Information on the [facility] has been sent to [facility] for pt and [facility] CSW has been notified that pt is interested in this program, and will assist with transportation needs.   Yousef Huge, LCSW, LCAS Disposition CSW [facility] [phone]  

## 2019-10-04 NOTE — ED Notes (Signed)
Pt resting with eyes closed, slow, even respirations

## 2019-10-04 NOTE — ED Provider Notes (Signed)
Patient seen by TTS this morning and they feel as though she is psychiatrically cleared for discharge.  Patient to follow-up as an outpatient.  Resources and referrals provided by TTS.   Veryl Speak, MD 10/04/19 1103

## 2019-10-04 NOTE — Discharge Instructions (Addendum)
Follow-up with outpatient counseling resources as provided by TTS representatives.

## 2019-10-04 NOTE — ED Notes (Signed)
Pt ambulated to restroom. 

## 2019-10-04 NOTE — ED Notes (Signed)
Pt given water per request but pt threw the water back up immediately. Pt asking for withdrawal meds, RN notified.

## 2020-08-20 ENCOUNTER — Other Ambulatory Visit: Payer: Self-pay

## 2020-08-20 ENCOUNTER — Emergency Department (HOSPITAL_COMMUNITY)
Admission: EM | Admit: 2020-08-20 | Discharge: 2020-08-20 | Disposition: A | Discharge status: Home / Self Care | Payer: Self-pay | Attending: Emergency Medicine | Admitting: Emergency Medicine

## 2020-08-20 ENCOUNTER — Encounter (HOSPITAL_COMMUNITY): Payer: Self-pay | Admitting: *Deleted

## 2020-08-20 DIAGNOSIS — Z5321 Procedure and treatment not carried out due to patient leaving prior to being seen by health care provider: Secondary | ICD-10-CM | POA: Insufficient documentation

## 2020-08-20 DIAGNOSIS — G40909 Epilepsy, unspecified, not intractable, without status epilepticus: Secondary | ICD-10-CM | POA: Insufficient documentation

## 2020-08-20 NOTE — ED Triage Notes (Signed)
Pt c/o 2 seizures yesterday and 2 today. Pt has epilepsy and has been taking her medications appropriately. Pt is A&O x 4 at time of triage. Pt states, "I just don't feel right since I've had the seizures". Pt c/o feeling unsteady and not being able to focus.

## 2020-08-20 NOTE — ED Triage Notes (Signed)
ED Registration left note for triage RN that pt had left from waiting room after triage but without being seen by provider.

## 2021-11-23 DEATH — deceased
